# Patient Record
Sex: Male | Born: 1970
Health system: Southern US, Community
[De-identification: ages and names within clinical notes are randomized; demographics above are authoritative.]

## PROBLEM LIST (undated history)

## (undated) DIAGNOSIS — E7800 Pure hypercholesterolemia, unspecified: Secondary | ICD-10-CM

## (undated) DIAGNOSIS — Z87442 Personal history of urinary calculi: Secondary | ICD-10-CM

## (undated) DIAGNOSIS — M549 Dorsalgia, unspecified: Secondary | ICD-10-CM

## (undated) DIAGNOSIS — K219 Gastro-esophageal reflux disease without esophagitis: Secondary | ICD-10-CM

## (undated) HISTORY — PX: VASECTOMY: SHX75

## (undated) HISTORY — PX: TONSILLECTOMY: SUR1361

## (undated) HISTORY — PX: MOUTH SURGERY: SHX715

## (undated) HISTORY — PX: OTHER SURGICAL HISTORY: SHX169

---

## 2015-03-24 ENCOUNTER — Other Ambulatory Visit: Payer: Self-pay | Admitting: Chiropractic Medicine

## 2015-03-24 ENCOUNTER — Ambulatory Visit
Admission: RE | Admit: 2015-03-24 | Discharge: 2015-03-24 | Disposition: A | Discharge status: Home / Self Care | Payer: BLUE CROSS/BLUE SHIELD | Source: Ambulatory Visit | Attending: Chiropractic Medicine | Admitting: Chiropractic Medicine

## 2015-03-24 DIAGNOSIS — M25551 Pain in right hip: Secondary | ICD-10-CM

## 2015-04-23 ENCOUNTER — Other Ambulatory Visit: Payer: Self-pay | Admitting: Chiropractic Medicine

## 2015-04-23 DIAGNOSIS — M25551 Pain in right hip: Secondary | ICD-10-CM

## 2015-05-07 ENCOUNTER — Other Ambulatory Visit: Payer: BLUE CROSS/BLUE SHIELD

## 2015-06-23 ENCOUNTER — Ambulatory Visit
Admission: RE | Admit: 2015-06-23 | Discharge: 2015-06-23 | Disposition: A | Discharge status: Home / Self Care | Payer: BLUE CROSS/BLUE SHIELD | Source: Ambulatory Visit | Attending: Chiropractic Medicine | Admitting: Chiropractic Medicine

## 2015-06-23 DIAGNOSIS — M25551 Pain in right hip: Secondary | ICD-10-CM

## 2015-06-23 MED ORDER — IOHEXOL 180 MG/ML  SOLN
10.0000 mL | Freq: Once | INTRAMUSCULAR | Status: AC | PRN
  Administered 2015-06-23: 10 mL via INTRA_ARTICULAR

## 2016-06-20 DIAGNOSIS — J029 Acute pharyngitis, unspecified: Secondary | ICD-10-CM | POA: Diagnosis not present

## 2016-08-12 DIAGNOSIS — H524 Presbyopia: Secondary | ICD-10-CM | POA: Diagnosis not present

## 2017-05-09 IMAGING — MR MR HIP*R* W/CM
1 series · 19 of 19 positions shown · IV contrast (agent unspecified)
Comparison: None.

CLINICAL DATA: Right hip pain for 30 years.  No known injury.

EXAM:
MRI OF THE RIGHT HIP WITH CONTRAST(MR Arthrogram)
TECHNIQUE: Multiplanar, multisequence MR imaging of the hip was performed
immediately following contrast injection into the hip joint under
fluoroscopic guidance. No intravenous contrast was administered.

[Series 3: T1 · axial · 4.0mm · 0.35mm/px · z∈[+5,+77]mm · 19 of 19 slices shown]
[im 1/19]
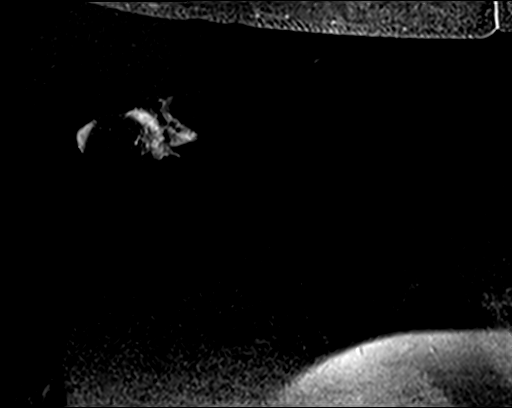
[im 2/19]
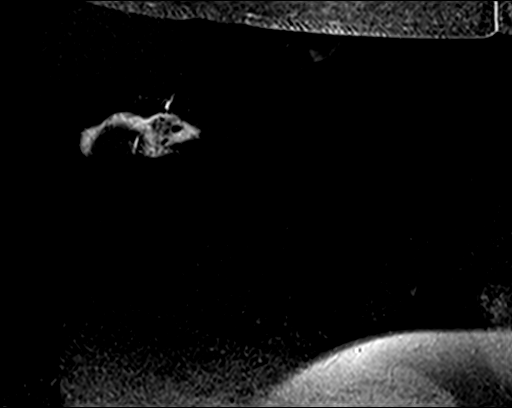
[im 3/19]
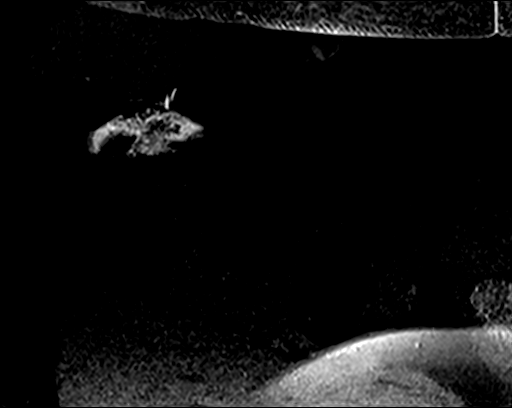
[im 4/19]
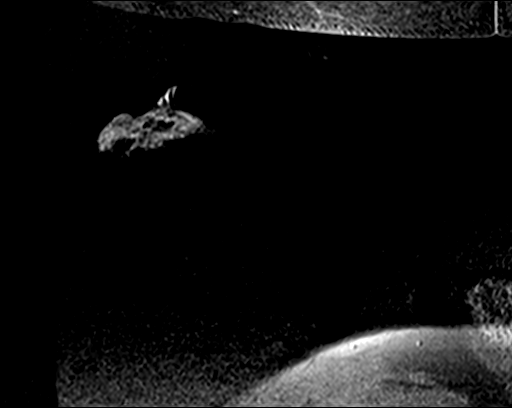
[im 5/19]
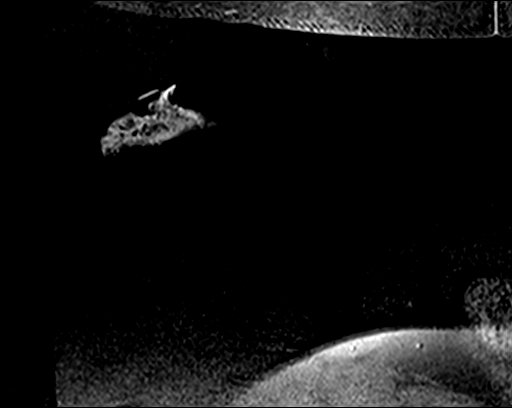
[im 6/19]
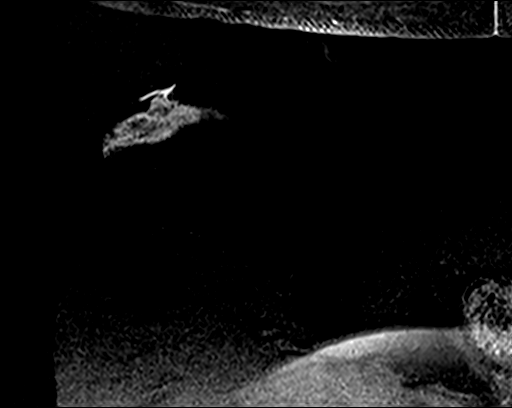
[im 7/19]
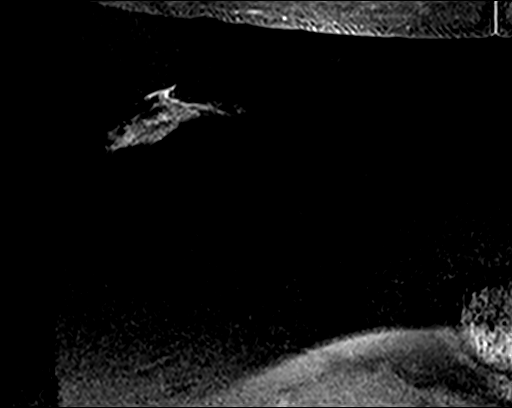
[im 8/19]
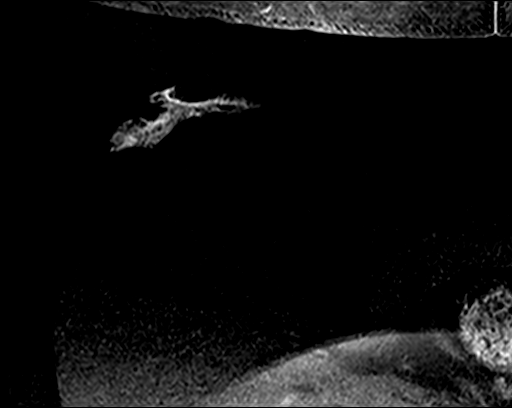
[im 9/19]
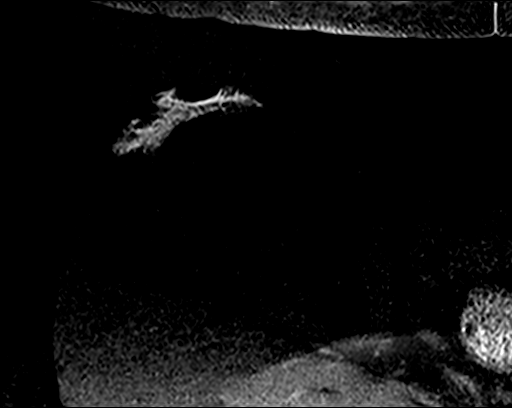
[im 10/19]
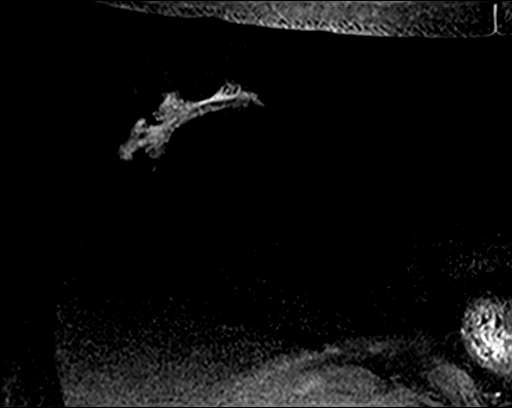
[im 11/19]
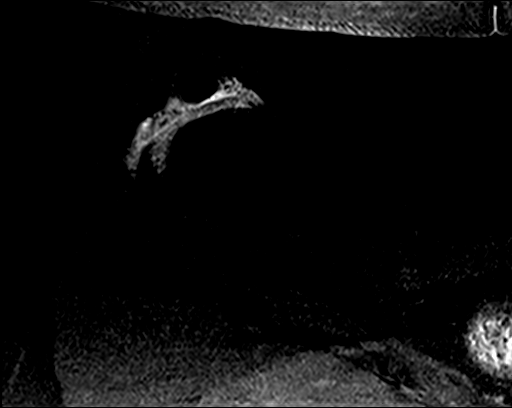
[im 12/19]
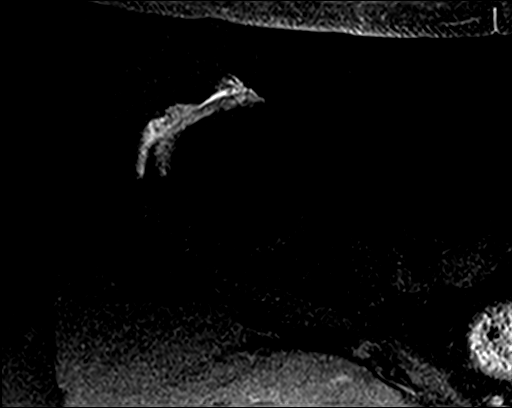
[im 13/19]
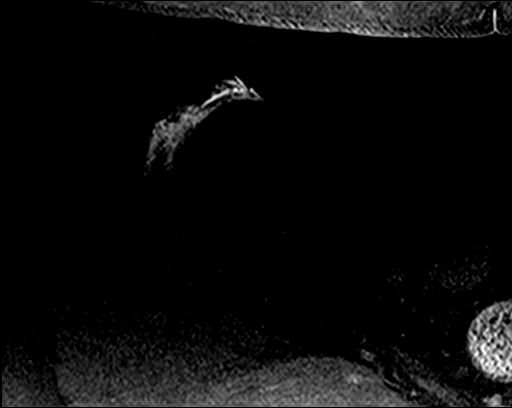
[im 14/19]
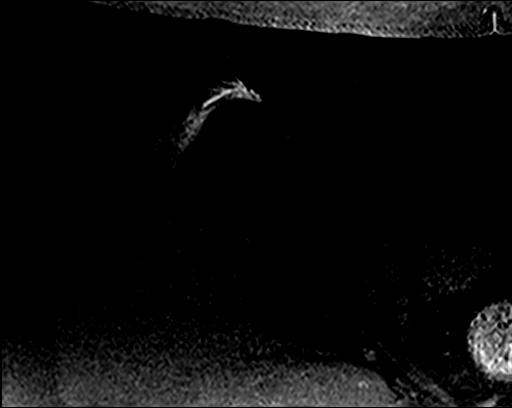
[im 15/19]
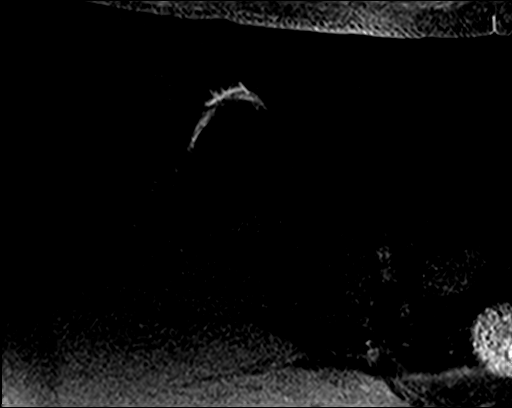
[im 16/19]
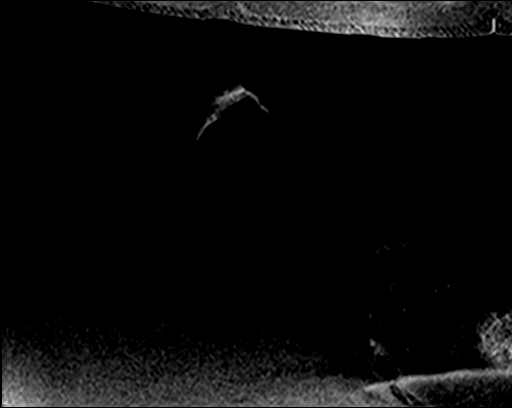
[im 17/19]
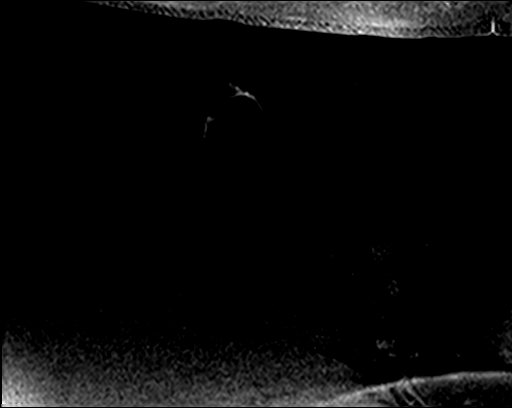
[im 18/19]
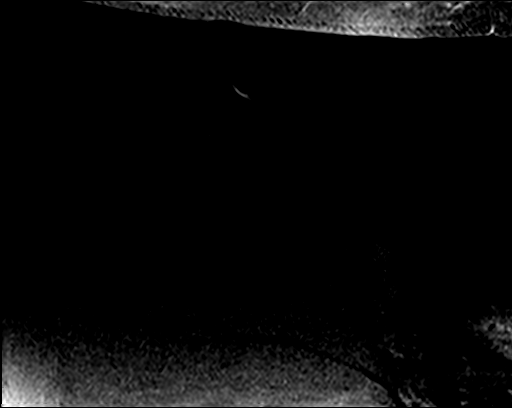
[im 19/19]
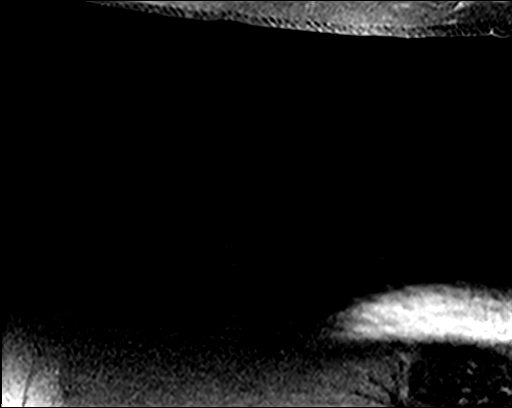

[19 of 19 positions shown; findings below may reference images not displayed]

FINDINGS: Bone

No acute fracture, dislocation or avascular necrosis mild
subchondral reactive marrow changes in the superior acetabulum
bilaterally. Bilateral marginal osteophytosis. Normal sacrum and
sacroiliac joints.

Alignment

Normal. No subluxation.

Dysplasia

None.

Joint effusion

Intra-articular contrast. No sacroiliac joint effusion. No left hip
joint effusion.

Labrum

Degeneration of the superior anterior right labrum with a labral
tear. No significant paralabral cyst identified.

Cartilage

Femoral cartilage: High-grade partial-thickness cartilage loss of
the superior right acetabulum. Left acetabular chondromalacia.

Acetabular cartilage: Partial thickness cartilage loss of the
superior femoral head. Left femoral head chondromalacia.

Capsule and ligaments

Normal.

Muscles and Tendons

Flexors: Normal.

Extensors: Normal.

Abductors: Normal.

Adductors: Normal.

Rotators: Normal.

Hamstrings: Small partial tear at the left hamstring origin.

Other Findings

None

Viscera

Normal. No abnormality seen in pelvis. No lymphadenopathy. No free
fluid in the pelvis.
IMPRESSION: 1. Degeneration of the superior anterior right labrum with a labral
tear. No significant paralabral cyst identified.
2. Moderate right hip osteoarthritis.
3. Mild left hip osteoarthritis.

## 2017-05-09 IMAGING — XA DG FLUORO GUIDE NDL PLC/BX
1 series · 1 of 1 positions shown · non-contrast
Comparison: none

CLINICAL DATA: Right hip pain.

[Series 1: ortho standard · 1 of 1 slices shown]
[im 1/1]
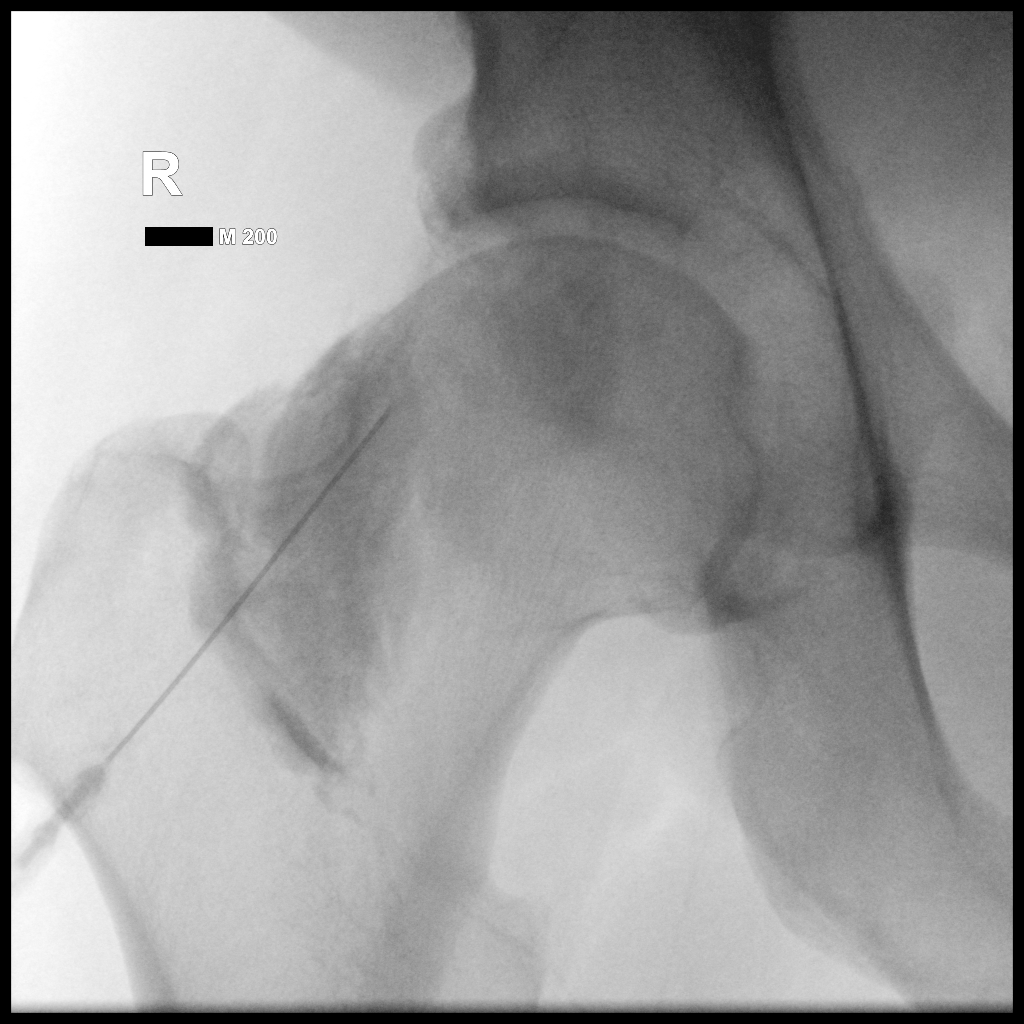

[1 of 1 positions shown; findings below may reference images not displayed]

EXAM:
RIGHT HIP INJECTION FOR MRI

FLUOROSCOPY TIME:  Radiation Exposure Index (as provided by the
fluoroscopic device): 124.9 microGray*m^2

Fluoroscopy Time (in minutes and seconds):  0 minutes 16 seconds

PROCEDURE:
Overlying skin prepped with Betadine, draped in the usual sterile
fashion, and infiltrated locally with Lidocaine. A 3.5 inch, 22
gauge spinal needle was advanced to the lateral aspect of the right
femoral head- neck junction. 1 ml of Lidocaine injected easily.
Initial contrast injection was extra-articular. The right hip was
reinjected with confirmation of intra-articular positioning. The
contrast mixture consisted of 0.05 mL of MultiHance, 5 mL of 1%
lidocaine, and 15 mL of Omnipaque 180. A total volume of 11 mL was
administered during the second injection. No immediate complication.
IMPRESSION: Technically successful right hip injection under fluoroscopy for MR
arthrogram.

## 2017-09-06 DIAGNOSIS — H6092 Unspecified otitis externa, left ear: Secondary | ICD-10-CM | POA: Diagnosis not present

## 2017-10-10 DIAGNOSIS — K625 Hemorrhage of anus and rectum: Secondary | ICD-10-CM | POA: Diagnosis not present

## 2018-01-17 DIAGNOSIS — E669 Obesity, unspecified: Secondary | ICD-10-CM | POA: Diagnosis not present

## 2018-01-17 DIAGNOSIS — Z13220 Encounter for screening for lipoid disorders: Secondary | ICD-10-CM | POA: Diagnosis not present

## 2018-01-17 DIAGNOSIS — Z0000 Encounter for general adult medical examination without abnormal findings: Secondary | ICD-10-CM | POA: Diagnosis not present

## 2018-04-24 DIAGNOSIS — H524 Presbyopia: Secondary | ICD-10-CM | POA: Diagnosis not present

## 2018-07-24 DIAGNOSIS — H60392 Other infective otitis externa, left ear: Secondary | ICD-10-CM | POA: Diagnosis not present

## 2018-08-01 DIAGNOSIS — H66002 Acute suppurative otitis media without spontaneous rupture of ear drum, left ear: Secondary | ICD-10-CM | POA: Diagnosis not present

## 2018-12-28 DIAGNOSIS — H60391 Other infective otitis externa, right ear: Secondary | ICD-10-CM | POA: Diagnosis not present

## 2019-01-30 DIAGNOSIS — H6983 Other specified disorders of Eustachian tube, bilateral: Secondary | ICD-10-CM | POA: Diagnosis not present

## 2019-02-17 DIAGNOSIS — Z1159 Encounter for screening for other viral diseases: Secondary | ICD-10-CM | POA: Diagnosis not present

## 2019-02-26 DIAGNOSIS — H66006 Acute suppurative otitis media without spontaneous rupture of ear drum, recurrent, bilateral: Secondary | ICD-10-CM | POA: Diagnosis not present

## 2019-02-26 DIAGNOSIS — H9313 Tinnitus, bilateral: Secondary | ICD-10-CM | POA: Diagnosis not present

## 2019-03-27 DIAGNOSIS — B369 Superficial mycosis, unspecified: Secondary | ICD-10-CM | POA: Diagnosis not present

## 2019-03-27 DIAGNOSIS — H6240 Otitis externa in other diseases classified elsewhere, unspecified ear: Secondary | ICD-10-CM | POA: Diagnosis not present

## 2019-03-27 DIAGNOSIS — H9312 Tinnitus, left ear: Secondary | ICD-10-CM | POA: Diagnosis not present

## 2019-03-27 DIAGNOSIS — H900 Conductive hearing loss, bilateral: Secondary | ICD-10-CM | POA: Diagnosis not present

## 2019-04-10 DIAGNOSIS — H6240 Otitis externa in other diseases classified elsewhere, unspecified ear: Secondary | ICD-10-CM | POA: Diagnosis not present

## 2019-04-10 DIAGNOSIS — H9313 Tinnitus, bilateral: Secondary | ICD-10-CM | POA: Diagnosis not present

## 2019-04-10 DIAGNOSIS — B369 Superficial mycosis, unspecified: Secondary | ICD-10-CM | POA: Diagnosis not present

## 2019-04-10 DIAGNOSIS — H9192 Unspecified hearing loss, left ear: Secondary | ICD-10-CM | POA: Diagnosis not present

## 2019-06-12 DIAGNOSIS — M7661 Achilles tendinitis, right leg: Secondary | ICD-10-CM | POA: Diagnosis not present

## 2019-06-12 DIAGNOSIS — M7662 Achilles tendinitis, left leg: Secondary | ICD-10-CM | POA: Diagnosis not present

## 2019-06-21 DIAGNOSIS — H524 Presbyopia: Secondary | ICD-10-CM | POA: Diagnosis not present

## 2019-07-22 DIAGNOSIS — E782 Mixed hyperlipidemia: Secondary | ICD-10-CM | POA: Diagnosis not present

## 2019-09-05 DIAGNOSIS — E669 Obesity, unspecified: Secondary | ICD-10-CM | POA: Diagnosis not present

## 2019-09-05 DIAGNOSIS — E782 Mixed hyperlipidemia: Secondary | ICD-10-CM | POA: Diagnosis not present

## 2019-10-02 DIAGNOSIS — R7303 Prediabetes: Secondary | ICD-10-CM | POA: Diagnosis not present

## 2019-10-02 DIAGNOSIS — E782 Mixed hyperlipidemia: Secondary | ICD-10-CM | POA: Diagnosis not present

## 2019-10-02 DIAGNOSIS — E669 Obesity, unspecified: Secondary | ICD-10-CM | POA: Diagnosis not present

## 2020-04-14 DIAGNOSIS — M25512 Pain in left shoulder: Secondary | ICD-10-CM | POA: Diagnosis not present

## 2020-04-14 DIAGNOSIS — Z23 Encounter for immunization: Secondary | ICD-10-CM | POA: Diagnosis not present

## 2020-04-14 DIAGNOSIS — B070 Plantar wart: Secondary | ICD-10-CM | POA: Diagnosis not present

## 2020-04-14 DIAGNOSIS — Z1211 Encounter for screening for malignant neoplasm of colon: Secondary | ICD-10-CM | POA: Diagnosis not present

## 2020-04-28 DIAGNOSIS — B070 Plantar wart: Secondary | ICD-10-CM | POA: Diagnosis not present

## 2020-05-06 DIAGNOSIS — M7542 Impingement syndrome of left shoulder: Secondary | ICD-10-CM | POA: Diagnosis not present

## 2020-05-06 DIAGNOSIS — M25512 Pain in left shoulder: Secondary | ICD-10-CM | POA: Diagnosis not present

## 2020-05-13 DIAGNOSIS — B070 Plantar wart: Secondary | ICD-10-CM | POA: Diagnosis not present

## 2020-06-09 DIAGNOSIS — B070 Plantar wart: Secondary | ICD-10-CM | POA: Diagnosis not present

## 2020-06-23 DIAGNOSIS — B070 Plantar wart: Secondary | ICD-10-CM | POA: Diagnosis not present

## 2020-07-07 DIAGNOSIS — B070 Plantar wart: Secondary | ICD-10-CM | POA: Diagnosis not present

## 2020-08-04 DIAGNOSIS — B070 Plantar wart: Secondary | ICD-10-CM | POA: Diagnosis not present

## 2020-08-18 DIAGNOSIS — M7912 Myalgia of auxiliary muscles, head and neck: Secondary | ICD-10-CM | POA: Diagnosis not present

## 2020-09-10 DIAGNOSIS — Z01812 Encounter for preprocedural laboratory examination: Secondary | ICD-10-CM | POA: Diagnosis not present

## 2020-09-15 DIAGNOSIS — K648 Other hemorrhoids: Secondary | ICD-10-CM | POA: Diagnosis not present

## 2020-09-15 DIAGNOSIS — K635 Polyp of colon: Secondary | ICD-10-CM | POA: Diagnosis not present

## 2020-09-15 DIAGNOSIS — K5730 Diverticulosis of large intestine without perforation or abscess without bleeding: Secondary | ICD-10-CM | POA: Diagnosis not present

## 2020-09-15 DIAGNOSIS — Z1211 Encounter for screening for malignant neoplasm of colon: Secondary | ICD-10-CM | POA: Diagnosis not present

## 2020-09-22 DIAGNOSIS — B079 Viral wart, unspecified: Secondary | ICD-10-CM | POA: Diagnosis not present

## 2020-10-19 ENCOUNTER — Ambulatory Visit: Payer: Self-pay | Admitting: Surgery

## 2020-10-19 DIAGNOSIS — K642 Third degree hemorrhoids: Secondary | ICD-10-CM | POA: Diagnosis not present

## 2020-10-19 DIAGNOSIS — K641 Second degree hemorrhoids: Secondary | ICD-10-CM | POA: Diagnosis not present

## 2020-10-19 DIAGNOSIS — K644 Residual hemorrhoidal skin tags: Secondary | ICD-10-CM | POA: Diagnosis not present

## 2020-10-19 NOTE — H&P (Signed)
Scott Stephenson Appointment: 10/19/2020 3:15 PM Location: [facility] Patient #: [id number] DOB: 1971/06/02 Married / Language: [place] / Race: White Male  History of Present Illness Scott Stephenson(Scott Stephenson C. Scott Gasparini MD; 10/19/2020 5:13 PM) The patient is a 50 year old male who presents with hemorrhoids. Note for "Hemorrhoids": ` ` ` Patient sent for surgical consultation at the request of Dr Moss McBrahmbhatt, [facility]  Chief Complaint: Rectal pain and hemorrhoids. ` ` The patient is a gentleman struggle with anal issues for many years. Thinks her hemorrhoids. He's had intermittent episodes of popping and swelling. Can often feel it on the outside. Had more challenging flares last year. Has tried to adjust his diet. Eating better with higher fiber. Still has occasionally hard stools. Usually has 1 or 2 a day. Has had worsening intermittent rectal bleeding. Had a colonoscopy. I cannot get those records. He recalls being told it was rather underwhelming aside from hemorrhoids. He has significant hemorrhoids of bleeding, surgical consultation recommended. Patient will have a without difficulty. He's never had any abdominal surgeries. No history of stroke or heart attack. No diabetes or sleep apnea. Does not smoke.  No personal nor family history of GI/colon cancer, inflammatory bowel disease, irritable bowel syndrome, allergy such as Celiac Sprue, dietary/dairy problems, colitis, ulcers nor gastritis. No recent sick contacts/gastroenteritis. No travel outside the country. No changes in diet. No dysphagia to solids or liquids. No significant heartburn or reflux. No melena, hematemesis, coffee ground emesis. No evidence of prior gastric/peptic ulceration.  (Review of systems as stated in this history (HPI) or in the review of systems. Otherwise all other 12 point ROS are negative) ` ` ###########################################`  This patient encounter took 30 minutes today to perform  the following: obtain history, perform exam, review outside records, interpret tests & imaging, counsel the patient on their diagnosis; and, document this encounter, including findings & plan in the electronic health record (EHR).   Past Surgical History [name]([name], CMA; 10/19/2020 3:21 PM) Colon Polyp Removal - Colonoscopy Oral Surgery Tonsillectomy Vasectomy  Diagnostic Studies History [name]([name], CMA; 10/19/2020 3:21 PM) Colonoscopy within last year  Allergies [name]([name], CMA; 10/19/2020 3:21 PM) No Known Drug Allergies [10/19/2020]:  Medication History [name]([name], CMA; 10/19/2020 3:21 PM) No Current Medications Medications Reconciled  Social History [name]([name], CMA; 10/19/2020 3:21 PM) Alcohol use Occasional alcohol use. Caffeine use Carbonated beverages. No drug use Tobacco use Never smoker.  Family History [name]([name], CMA; 10/19/2020 3:21 PM) Cancer Father, Mother. Depression Son. Migraine Headache Mother.  Other Problems [name]([name], CMA; 10/19/2020 3:21 PM) Back Pain Enlarged Prostate Gastroesophageal Reflux Disease Hemorrhoids Hypercholesterolemia Kidney Stone     Review of Systems [name]([name] CMA; 10/19/2020 3:21 PM) General Not Present- Appetite Loss, Chills, Fatigue, Fever, Night Sweats, Weight Gain and Weight Loss. Skin Not Present- Change in Wart/Mole, Dryness, Hives, Jaundice, New Lesions, Non-Healing Wounds, Rash and Ulcer. HEENT Present- Ringing in the Ears, Seasonal Allergies and Wears glasses/contact lenses. Not Present- Earache, Hearing Loss, Hoarseness, Nose Bleed, Oral Ulcers, Sinus Pain, Sore Throat, Visual Disturbances and Yellow Eyes. Respiratory Not Present- Bloody sputum, Chronic Cough, Difficulty Breathing, Snoring and Wheezing. Breast Not Present- Breast Mass, Breast Pain, Nipple Discharge and Skin Changes. Cardiovascular Not Present- Chest Pain, Difficulty Breathing Lying Down, Leg  Cramps, Palpitations, Rapid Heart Rate, Shortness of Breath and Swelling of Extremities. Gastrointestinal Present- Hemorrhoids. Not Present- Abdominal Pain, Bloating, Bloody Stool, Change in Bowel Habits, Chronic diarrhea, Constipation, Difficulty Swallowing, Excessive gas, Gets full quickly at meals,  Indigestion, Nausea, Rectal Pain and Vomiting. Male Genitourinary Not Present- Blood in Urine, Change in Urinary Stream, Frequency, Impotence, Nocturia, Painful Urination, Urgency and Urine Leakage. Musculoskeletal Not Present- Back Pain, Joint Pain, Joint Stiffness, Muscle Pain, Muscle Weakness and Swelling of Extremities. Neurological Not Present- Decreased Memory, Fainting, Headaches, Numbness, Seizures, Tingling, Tremor, Trouble walking and Weakness. Psychiatric Not Present- Anxiety, Bipolar, Change in Sleep Pattern, Depression, Fearful and Frequent crying. Endocrine Not Present- Cold Intolerance, Excessive Hunger, Hair Changes, Heat Intolerance, Hot flashes and New Diabetes. Hematology Not Present- Blood Thinners, Easy Bruising, Excessive bleeding, Gland problems, HIV and Persistent Infections.  Vitals ([name] CMA; 10/19/2020 3:21 PM) 10/19/2020 3:21 PM Weight: 217.2 lb Height: 71in Body Surface Area: 2.18 m Body Mass Index: 30.29 kg/m  Pulse: 62 (Regular)  BP: 110/64(Sitting, Left Arm, Standard)        Physical Exam Scott Sportsman MD; 10/19/2020 3:54 PM)  General Mental Status-Alert. General Appearance-Not in acute distress, Not Sickly. Orientation-Oriented X3. Hydration-Well hydrated. Voice-Normal.  Integumentary Global Assessment Upon inspection and palpation of skin surfaces of the - Axillae: non-tender, no inflammation or ulceration, no drainage. and Distribution of scalp and body hair is normal. General Characteristics Temperature - normal warmth is noted.  Head and Neck Head-normocephalic, atraumatic with no lesions or palpable  masses. Face Global Assessment - atraumatic, no absence of expression. Neck Global Assessment - no abnormal movements, no bruit auscultated on the right, no bruit auscultated on the left, no decreased range of motion, non-tender. Trachea-midline. Thyroid Gland Characteristics - non-tender.  Eye Eyeball - Left-Extraocular movements intact, No Nystagmus - Left. Eyeball - Right-Extraocular movements intact, No Nystagmus - Right. Cornea - Left-No Hazy - Left. Cornea - Right-No Hazy - Right. Sclera/Conjunctiva - Left-No scleral icterus, No Discharge - Left. Sclera/Conjunctiva - Right-No scleral icterus, No Discharge - Right. Pupil - Left-Direct reaction to light normal. Pupil - Right-Direct reaction to light normal.  ENMT Ears Pinna - Left - no drainage observed, no generalized tenderness observed. Pinna - Right - no drainage observed, no generalized tenderness observed. Nose and Sinuses External Inspection of the Nose - no destructive lesion observed. Inspection of the nares - Left - quiet respiration. Inspection of the nares - Right - quiet respiration. Mouth and Throat Lips - Upper Lip - no fissures observed, no pallor noted. Lower Lip - no fissures observed, no pallor noted. Nasopharynx - no discharge present. Oral Cavity/Oropharynx - Tongue - no dryness observed. Oral Mucosa - no cyanosis observed. Hypopharynx - no evidence of airway distress observed.  Chest and Lung Exam Inspection Movements - Normal and Symmetrical. Accessory muscles - No use of accessory muscles in breathing. Palpation Palpation of the chest reveals - Non-tender. Auscultation Breath sounds - Normal and Clear.  Cardiovascular Auscultation Rhythm - Regular. Murmurs & Other Heart Sounds - Auscultation of the heart reveals - No Murmurs and No Systolic Clicks.  Abdomen Inspection Inspection of the abdomen reveals - No Visible peristalsis and No Abnormal pulsations. Umbilicus - No Bleeding, No  Urine drainage. Palpation/Percussion Palpation and Percussion of the abdomen reveal - Soft, Non Tender, No Rebound tenderness, No Rigidity (guarding) and No Cutaneous hyperesthesia. Note: Abdomen soft. Nontender. Not distended. No umbilical or incisional hernias. No guarding.  Male Genitourinary Sexual Maturity Tanner 5 - Adult hair pattern and Adult penile size and shape. Note: No inguinal hernias. Normal external genitalia. Epididymi, testes, and spermatic cords normal without any masses.  Rectal Note: ###########################  Please refer to anoscopy.  Left lateral grade 3/4 prolapsed  hemorrhoid friable. Large right posterior internal hemorrhoid at least grade 2 probably grade 3. Right anterior grade 2.  No fissure or fistula or abscess. A few external hemorrhoidal tags. Thin but intact anal sphincter. No proctitis obvious. No condyloma. Prostate smooth and not enlarged.  Peripheral Vascular Upper Extremity Inspection - Left - No Cyanotic nailbeds - Left, Not Ischemic. Inspection - Right - No Cyanotic nailbeds - Right, Not Ischemic.  Neurologic Neurologic evaluation reveals -normal attention span and ability to concentrate, able to name objects and repeat phrases. Appropriate fund of knowledge , normal sensation and normal coordination. Mental Status Affect - not angry, not paranoid. Cranial Nerves-Normal Bilaterally. Gait-Normal.  Neuropsychiatric Mental status exam performed with findings of-able to articulate well with normal speech/language, rate, volume and coherence, thought content normal with ability to perform basic computations and apply abstract reasoning and no evidence of hallucinations, delusions, obsessions or homicidal/suicidal ideation.  Musculoskeletal Global Assessment Spine, Ribs and Pelvis - no instability, subluxation or laxity. Right Upper Extremity - no instability, subluxation or laxity.  Lymphatic Head & Neck  General Head & Neck  Lymphatics: Bilateral - Description - No Localized lymphadenopathy. Axillary  General Axillary Region: Bilateral - Description - No Localized lymphadenopathy. Femoral & Inguinal  Generalized Femoral & Inguinal Lymphatics: Left - Description - No Localized lymphadenopathy. Right - Description - No Localized lymphadenopathy.   Results Scott Sportsman MD; 10/19/2020 5:12 PM) Procedures  Name Value Date Hemorrhoids Procedure Anal exam: External Hemorrhoid Internal exam: Internal Hemorroids ( non-bleeding) Internal Hemorrhoids (bleeding) prolapse Other: Left lateral grade 3/4 prolapsed hemorrhoid friable. Large right posterior internal hemorrhoid at least grade 2 probably grade 3. Right anterior grade 2............Marland KitchenNo fissure or fistula or abscess. A few external hemorrhoidal tags. Thin but intact anal sphincter. No proctitis obvious. No condyloma. Prostate smooth and not enlarged.  Performed: 10/19/2020 3:54 PM    Assessment & Plan Scott Sportsman MD; 10/19/2020 3:57 PM)  PROLAPSED INTERNAL HEMORRHOIDS, GRADE 3 (K64.2) Impression: Large internal hemorrhoids with left lateral partially prolapsed. Right posterior rather large and easily prolapsing. Still struggled with intermittent flares of bleeding and discomfort despite good diet bowel regimen and relatively improved bowel function.  At the very least we could offer her series of banding spelled I suspect hemorrhoid ligation Paxene hemorrhoidectomy would be a day more aggressive and appropriate option. He would like to consider hemorrhoidectomy to get his anatomy after better control. He wants double check about insurance coverage and postoperative recovery. Work to coordinate a convenient time.  Current Plans ANOSCOPY, DIAGNOSTIC (37342) Pt Education - [facility] Hemorrhoids (Barak Bialecki): discussed with patient and provided information. Pt Education - Pamphlet Given - The Hemorrhoid Book: discussed with patient and  provided information. The anatomy & physiology of the anorectal region was discussed. The pathophysiology of hemorrhoids and differential diagnosis was discussed. Natural history risks without surgery was discussed. I stressed the importance of a bowel regimen to have daily soft bowel movements to minimize progression of disease. Interventions such as sclerotherapy & banding were discussed.  The patient's symptoms are not adequately controlled by medicines and other non-operative treatments. I feel the risks & problems of no surgery outweigh the operative risks; therefore, I recommended surgery to treat the hemorrhoids by ligation, pexy, and possible resection.  Risks such as bleeding, infection, urinary difficulties, need for further treatment, heart attack, death, and other risks were discussed. I noted a good likelihood this will help address the problem. Goals of post-operative recovery were discussed as well. Possibility that this will  not correct all symptoms was explained. Post-operative pain, bleeding, constipation, and other problems after surgery were discussed. We will work to minimize complications. Educational handouts further explaining the pathology, treatment options, and bowel regimen were given as well. Questions were answered. The patient expresses understanding & wishes to proceed with surgery.   PROLAPSED INTERNAL HEMORRHOIDS, GRADE 2 (K64.1) Impression: The anatomy & physiology of the anorectal region was discussed. The pathophysiology of hemorrhoids and differential diagnosis was discussed. Natural history progression was discussed. I stressed the importance of a bowel regimen to have daily soft bowel movements to minimize progression of disease. Goal of one BM / day ideal. Use of wet wipes, warm baths, avoiding straining, etc were emphasized.  Educational handouts further explaining the pathology, treatment options, and bowel regimen were given as well. The patient  expressed understanding.   EXTERNAL HEMORRHOIDS WITH COMPLICATION (K64.4)   ENCOUNTER FOR PREOPERATIVE EXAMINATION FOR GENERAL SURGICAL PROCEDURE (Z01.818)  Current Plans You are being scheduled for surgery- Our schedulers will call you.  You should hear from our office's scheduling department within 5 working days about the location, date, and time of surgery. We try to make accommodations for patient's preferences in scheduling surgery, but sometimes the OR schedule or the surgeon's schedule prevents [place] from making those accommodations.  If you have not heard from our office [phone]) in 5 working days, call the office and ask for your surgeon's nurse.  If you have other questions about your diagnosis, plan, or surgery, call the office and ask for your surgeon's nurse.  The anatomy & physiology of the anorectal region was discussed. The pathophysiology of hemorrhoids and differential diagnosis was discussed. Natural history risks without surgery was discussed. I stressed the importance of a bowel regimen to have daily soft bowel movements to minimize progression of disease. Interventions such as sclerotherapy & banding were discussed.  The patient's symptoms are not adequately controlled by medicines and other non-operative treatments. I feel the risks & problems of no surgery outweigh the operative risks; therefore, I recommended surgery to treat the hemorrhoids by ligation, pexy, and possible resection.  Risks such as bleeding, infection, urinary difficulties, need for further treatment, heart attack, death, and other risks were discussed. I noted a good likelihood this will help address the problem. Goals of post-operative recovery were discussed as well. Possibility that this will not correct all symptoms was explained. Post-operative pain, bleeding, constipation, and other problems after surgery were discussed. We will work to minimize complications. Educational  handouts further explaining the pathology, treatment options, and bowel regimen were given as well. Questions were answered. The patient expresses understanding & wishes to proceed with surgery.  Pt Education - [facility] Rectal Prep for Anorectal outpatient/office surgery: discussed with patient and provided information. Pt Education - [facility] Rectal Surgery HCI (Bettyjean Stefanski): discussed with patient and provided information.  Scott Sportsman, MD, FACS, MASCRS  Esophageal, Gastrointestinal & Colorectal Surgery [facility] [address] [place], [place] [address] [phone] Fax [phone] Main/Paging  CONTACT INFORMATION: Weekday (9AM-5PM) concerns: Call [facility] main office at [phone] Weeknight (5PM-9AM) or Weekend/Holiday concerns: Check [email or web address] for General Surgery [facility] coverage (Please, do not use SecureChat as it is not reliable communication to operating surgeons for immediate patient care)

## 2020-10-20 DIAGNOSIS — H524 Presbyopia: Secondary | ICD-10-CM | POA: Diagnosis not present

## 2020-11-06 DIAGNOSIS — M7661 Achilles tendinitis, right leg: Secondary | ICD-10-CM | POA: Diagnosis not present

## 2020-11-06 DIAGNOSIS — M545 Low back pain, unspecified: Secondary | ICD-10-CM | POA: Diagnosis not present

## 2020-11-06 DIAGNOSIS — M7662 Achilles tendinitis, left leg: Secondary | ICD-10-CM | POA: Diagnosis not present

## 2020-11-10 DIAGNOSIS — B078 Other viral warts: Secondary | ICD-10-CM | POA: Diagnosis not present

## 2020-11-13 DIAGNOSIS — M779 Enthesopathy, unspecified: Secondary | ICD-10-CM

## 2020-11-13 HISTORY — DX: Enthesopathy, unspecified: M77.9

## 2020-11-17 DIAGNOSIS — M9906 Segmental and somatic dysfunction of lower extremity: Secondary | ICD-10-CM | POA: Diagnosis not present

## 2020-11-17 DIAGNOSIS — M7711 Lateral epicondylitis, right elbow: Secondary | ICD-10-CM | POA: Diagnosis not present

## 2020-11-17 DIAGNOSIS — M7662 Achilles tendinitis, left leg: Secondary | ICD-10-CM | POA: Diagnosis not present

## 2020-11-17 DIAGNOSIS — M1611 Unilateral primary osteoarthritis, right hip: Secondary | ICD-10-CM | POA: Diagnosis not present

## 2020-11-19 DIAGNOSIS — M9906 Segmental and somatic dysfunction of lower extremity: Secondary | ICD-10-CM | POA: Diagnosis not present

## 2020-11-19 DIAGNOSIS — M7711 Lateral epicondylitis, right elbow: Secondary | ICD-10-CM | POA: Diagnosis not present

## 2020-11-19 DIAGNOSIS — M1611 Unilateral primary osteoarthritis, right hip: Secondary | ICD-10-CM | POA: Diagnosis not present

## 2020-11-19 DIAGNOSIS — M7662 Achilles tendinitis, left leg: Secondary | ICD-10-CM | POA: Diagnosis not present

## 2020-11-23 DIAGNOSIS — M9906 Segmental and somatic dysfunction of lower extremity: Secondary | ICD-10-CM | POA: Diagnosis not present

## 2020-11-23 DIAGNOSIS — M1611 Unilateral primary osteoarthritis, right hip: Secondary | ICD-10-CM | POA: Diagnosis not present

## 2020-11-23 DIAGNOSIS — M7662 Achilles tendinitis, left leg: Secondary | ICD-10-CM | POA: Diagnosis not present

## 2020-11-23 DIAGNOSIS — M7711 Lateral epicondylitis, right elbow: Secondary | ICD-10-CM | POA: Diagnosis not present

## 2020-12-17 DIAGNOSIS — B078 Other viral warts: Secondary | ICD-10-CM | POA: Diagnosis not present

## 2020-12-18 ENCOUNTER — Encounter (HOSPITAL_BASED_OUTPATIENT_CLINIC_OR_DEPARTMENT_OTHER): Payer: Self-pay | Admitting: Surgery

## 2020-12-18 DIAGNOSIS — K649 Unspecified hemorrhoids: Secondary | ICD-10-CM

## 2020-12-18 DIAGNOSIS — K6289 Other specified diseases of anus and rectum: Secondary | ICD-10-CM

## 2020-12-18 HISTORY — DX: Other specified diseases of anus and rectum: K62.89

## 2020-12-18 HISTORY — DX: Unspecified hemorrhoids: K64.9

## 2020-12-21 ENCOUNTER — Encounter (HOSPITAL_BASED_OUTPATIENT_CLINIC_OR_DEPARTMENT_OTHER): Payer: Self-pay | Admitting: Surgery

## 2020-12-21 ENCOUNTER — Other Ambulatory Visit (HOSPITAL_COMMUNITY)
Admission: RE | Admit: 2020-12-21 | Discharge: 2020-12-21 | Disposition: A | Discharge status: Home / Self Care | Payer: BC Managed Care – PPO | Source: Ambulatory Visit | Attending: Surgery | Admitting: Surgery

## 2020-12-21 ENCOUNTER — Other Ambulatory Visit: Payer: Self-pay

## 2020-12-21 DIAGNOSIS — K644 Residual hemorrhoidal skin tags: Secondary | ICD-10-CM | POA: Diagnosis not present

## 2020-12-21 DIAGNOSIS — Z20822 Contact with and (suspected) exposure to covid-19: Secondary | ICD-10-CM | POA: Insufficient documentation

## 2020-12-21 DIAGNOSIS — K642 Third degree hemorrhoids: Secondary | ICD-10-CM | POA: Diagnosis not present

## 2020-12-21 DIAGNOSIS — K625 Hemorrhage of anus and rectum: Secondary | ICD-10-CM | POA: Diagnosis not present

## 2020-12-21 DIAGNOSIS — Z01812 Encounter for preprocedural laboratory examination: Secondary | ICD-10-CM | POA: Insufficient documentation

## 2020-12-21 DIAGNOSIS — M1990 Unspecified osteoarthritis, unspecified site: Secondary | ICD-10-CM

## 2020-12-21 DIAGNOSIS — B079 Viral wart, unspecified: Secondary | ICD-10-CM

## 2020-12-21 DIAGNOSIS — R519 Headache, unspecified: Secondary | ICD-10-CM

## 2020-12-21 DIAGNOSIS — K219 Gastro-esophageal reflux disease without esophagitis: Secondary | ICD-10-CM | POA: Diagnosis not present

## 2020-12-21 DIAGNOSIS — Z973 Presence of spectacles and contact lenses: Secondary | ICD-10-CM

## 2020-12-21 HISTORY — DX: Presence of spectacles and contact lenses: Z97.3

## 2020-12-21 HISTORY — DX: Viral wart, unspecified: B07.9

## 2020-12-21 HISTORY — DX: Headache, unspecified: R51.9

## 2020-12-21 HISTORY — DX: Unspecified osteoarthritis, unspecified site: M19.90

## 2020-12-21 LAB — SARS CORONAVIRUS 2 (TAT 6-24 HRS): SARS Coronavirus 2: NEGATIVE

## 2020-12-21 NOTE — Progress Notes (Signed)
Spoke w/ via phone for pre-op interview---pt Lab needs dos----   none           Lab results------none COVID test -----patient states asymptomatic no test needed Arrive at -------815 am 12-24-2020 NPO after MN NO Solid Food.  Clear liquids from MN until---715 am then npo (pt wishes to not pick up ensure presurgery drink) Med rec completed Medications to take morning of surgery -----none Diabetic medication -----n/a Patient instructed no nail polish to be worn day of surgery n/a Patient instructed to bring photo id and insurance card day of surgery Patient aware to have Driver (ride ) / caregiver   wife Scott Stephenson will stay  for 24 hours after surgery  Patient Special Instructions -----none Pre-Op special Istructions -----none Patient verbalized understanding of instructions that were given at this phone interview. Patient denies shortness of breath, chest pain, fever, cough at this phone interview.

## 2020-12-23 NOTE — Anesthesia Preprocedure Evaluation (Addendum)
Anesthesia Evaluation  Patient identified by MRN, date of birth, ID band Patient awake    Reviewed: Allergy & Precautions, NPO status , Patient's Chart, lab work & pertinent test results  History of Anesthesia Complications Negative for: history of anesthetic complications  Airway Mallampati: II  TM Distance: >3 FB Neck ROM: Full    Dental no notable dental hx. (+) Dental Advisory Given   Pulmonary neg pulmonary ROS,    Pulmonary exam normal        Cardiovascular negative cardio ROS Normal cardiovascular exam     Neuro/Psych negative neurological ROS     GI/Hepatic Neg liver ROS, GERD  ,  Endo/Other  negative endocrine ROS  Renal/GU negative Renal ROS     Musculoskeletal  (+) Arthritis ,   Abdominal   Peds  Hematology negative hematology ROS (+)   Anesthesia Other Findings   Reproductive/Obstetrics                            Anesthesia Physical Anesthesia Plan  ASA: 2  Anesthesia Plan: General   Post-op Pain Management:    Induction: Intravenous  PONV Risk Score and Plan: 3 and Ondansetron, Dexamethasone and Midazolam  Airway Management Planned: Oral ETT  Additional Equipment:   Intra-op Plan:   Post-operative Plan: Extubation in OR  Informed Consent: I have reviewed the patients History and Physical, chart, labs and discussed the procedure including the risks, benefits and alternatives for the proposed anesthesia with the patient or authorized representative who has indicated his/her understanding and acceptance.     Dental advisory given  Plan Discussed with: Anesthesiologist and CRNA  Anesthesia Plan Comments:       Anesthesia Quick Evaluation

## 2020-12-24 ENCOUNTER — Ambulatory Visit (HOSPITAL_BASED_OUTPATIENT_CLINIC_OR_DEPARTMENT_OTHER): Payer: BC Managed Care – PPO | Admitting: Anesthesiology

## 2020-12-24 ENCOUNTER — Encounter (HOSPITAL_BASED_OUTPATIENT_CLINIC_OR_DEPARTMENT_OTHER)
Admission: RE | Disposition: A | Discharge status: Home / Self Care | Payer: Self-pay | Source: Home / Self Care | Attending: Surgery

## 2020-12-24 ENCOUNTER — Ambulatory Visit (HOSPITAL_BASED_OUTPATIENT_CLINIC_OR_DEPARTMENT_OTHER)
Admission: RE | Admit: 2020-12-24 | Discharge: 2020-12-24 | Disposition: A | Discharge status: Home / Self Care | Payer: BC Managed Care – PPO | Attending: Surgery | Admitting: Surgery

## 2020-12-24 ENCOUNTER — Encounter (HOSPITAL_BASED_OUTPATIENT_CLINIC_OR_DEPARTMENT_OTHER): Payer: Self-pay | Admitting: Surgery

## 2020-12-24 DIAGNOSIS — Z20822 Contact with and (suspected) exposure to covid-19: Secondary | ICD-10-CM | POA: Diagnosis not present

## 2020-12-24 DIAGNOSIS — K644 Residual hemorrhoidal skin tags: Secondary | ICD-10-CM | POA: Insufficient documentation

## 2020-12-24 DIAGNOSIS — K643 Fourth degree hemorrhoids: Secondary | ICD-10-CM | POA: Diagnosis not present

## 2020-12-24 DIAGNOSIS — K219 Gastro-esophageal reflux disease without esophagitis: Secondary | ICD-10-CM | POA: Insufficient documentation

## 2020-12-24 DIAGNOSIS — K642 Third degree hemorrhoids: Secondary | ICD-10-CM | POA: Insufficient documentation

## 2020-12-24 DIAGNOSIS — E7800 Pure hypercholesterolemia, unspecified: Secondary | ICD-10-CM | POA: Diagnosis not present

## 2020-12-24 DIAGNOSIS — K625 Hemorrhage of anus and rectum: Secondary | ICD-10-CM | POA: Diagnosis not present

## 2020-12-24 HISTORY — PX: EVALUATION UNDER ANESTHESIA WITH HEMORRHOIDECTOMY: SHX5624

## 2020-12-24 HISTORY — DX: Gastro-esophageal reflux disease without esophagitis: K21.9

## 2020-12-24 HISTORY — DX: Dorsalgia, unspecified: M54.9

## 2020-12-24 HISTORY — DX: Pure hypercholesterolemia, unspecified: E78.00

## 2020-12-24 HISTORY — DX: Personal history of urinary calculi: Z87.442

## 2020-12-24 SURGERY — EXAM UNDER ANESTHESIA WITH HEMORRHOIDECTOMY
Anesthesia: General | Site: Rectum

## 2020-12-24 MED ORDER — GABAPENTIN 300 MG PO CAPS
300.0000 mg | ORAL_CAPSULE | ORAL | Status: AC
  Administered 2020-12-24: 300 mg via ORAL

## 2020-12-24 MED ORDER — DIAZEPAM 5 MG PO TABS: 5.0000 mg | ORAL_TABLET | Freq: Four times a day (QID) | ORAL | 2 refills | Status: AC | PRN

## 2020-12-24 MED ORDER — OXYCODONE HCL 5 MG PO TABS: 5.0000 mg | ORAL_TABLET | Freq: Four times a day (QID) | ORAL | 0 refills | Status: AC | PRN

## 2020-12-24 MED ORDER — OXYCODONE HCL 5 MG PO TABS
5.0000 mg | ORAL_TABLET | ORAL | Status: AC
  Administered 2020-12-24: 5 mg via ORAL

## 2020-12-24 MED ORDER — ONDANSETRON HCL 4 MG/2ML IJ SOLN
INTRAMUSCULAR | Status: AC
  Filled 2020-12-24: qty 2

## 2020-12-24 MED ORDER — EPHEDRINE SULFATE-NACL 50-0.9 MG/10ML-% IV SOSY
PREFILLED_SYRINGE | INTRAVENOUS | Status: DC | PRN
  Administered 2020-12-24: 10 mg via INTRAVENOUS
  Administered 2020-12-24: 15 mg via INTRAVENOUS

## 2020-12-24 MED ORDER — PROPOFOL 10 MG/ML IV BOLUS
INTRAVENOUS | Status: DC | PRN
  Administered 2020-12-24: 200 mg via INTRAVENOUS

## 2020-12-24 MED ORDER — PROMETHAZINE HCL 25 MG/ML IJ SOLN: 6.2500 mg | INTRAMUSCULAR | Status: DC | PRN

## 2020-12-24 MED ORDER — EPHEDRINE 5 MG/ML INJ
INTRAVENOUS | Status: AC
  Filled 2020-12-24: qty 10

## 2020-12-24 MED ORDER — ONDANSETRON HCL 4 MG/2ML IJ SOLN
INTRAMUSCULAR | Status: DC | PRN
  Administered 2020-12-24: 4 mg via INTRAVENOUS

## 2020-12-24 MED ORDER — 0.9 % SODIUM CHLORIDE (POUR BTL) OPTIME
TOPICAL | Status: DC | PRN
  Administered 2020-12-24: 1000 mL

## 2020-12-24 MED ORDER — MIDAZOLAM HCL 2 MG/2ML IJ SOLN
INTRAMUSCULAR | Status: DC | PRN
  Administered 2020-12-24: 2 mg via INTRAVENOUS

## 2020-12-24 MED ORDER — PHENYLEPHRINE 40 MCG/ML (10ML) SYRINGE FOR IV PUSH (FOR BLOOD PRESSURE SUPPORT)
PREFILLED_SYRINGE | INTRAVENOUS | Status: AC
  Filled 2020-12-24: qty 10

## 2020-12-24 MED ORDER — ENSURE PRE-SURGERY PO LIQD: 296.0000 mL | Freq: Once | ORAL | Status: DC

## 2020-12-24 MED ORDER — SUGAMMADEX SODIUM 200 MG/2ML IV SOLN
INTRAVENOUS | Status: DC | PRN
  Administered 2020-12-24: 200 mg via INTRAVENOUS

## 2020-12-24 MED ORDER — DEXAMETHASONE SODIUM PHOSPHATE 10 MG/ML IJ SOLN
INTRAMUSCULAR | Status: DC | PRN
  Administered 2020-12-24: 5 mg via INTRAVENOUS

## 2020-12-24 MED ORDER — BUPIVACAINE LIPOSOME 1.3 % IJ SUSP
INTRAMUSCULAR | Status: DC | PRN
  Administered 2020-12-24: 20 mL

## 2020-12-24 MED ORDER — BUPIVACAINE LIPOSOME 1.3 % IJ SUSP
20.0000 mL | Freq: Once | INTRAMUSCULAR | Status: DC
Start: 2020-12-24 — End: 2020-12-25

## 2020-12-24 MED ORDER — METRONIDAZOLE 500 MG/100ML IV SOLN
INTRAVENOUS | Status: AC
  Filled 2020-12-24: qty 100

## 2020-12-24 MED ORDER — CELECOXIB 200 MG PO CAPS
200.0000 mg | ORAL_CAPSULE | ORAL | Status: AC
  Administered 2020-12-24: 200 mg via ORAL

## 2020-12-24 MED ORDER — SODIUM CHLORIDE 0.9 % IV SOLN
INTRAVENOUS | Status: AC
  Filled 2020-12-24: qty 100

## 2020-12-24 MED ORDER — GABAPENTIN 300 MG PO CAPS
ORAL_CAPSULE | ORAL | Status: AC
  Filled 2020-12-24: qty 1

## 2020-12-24 MED ORDER — ACETAMINOPHEN 500 MG PO TABS
ORAL_TABLET | ORAL | Status: AC
  Filled 2020-12-24: qty 2

## 2020-12-24 MED ORDER — FENTANYL CITRATE (PF) 100 MCG/2ML IJ SOLN
INTRAMUSCULAR | Status: AC
  Filled 2020-12-24: qty 2

## 2020-12-24 MED ORDER — CHLORHEXIDINE GLUCONATE CLOTH 2 % EX PADS
6.0000 | MEDICATED_PAD | Freq: Once | CUTANEOUS | Status: DC
Start: 2020-12-24 — End: 2020-12-25

## 2020-12-24 MED ORDER — OXYCODONE HCL 5 MG PO TABS
ORAL_TABLET | ORAL | Status: AC
  Filled 2020-12-24: qty 1

## 2020-12-24 MED ORDER — DIAZEPAM 5 MG PO TABS: 5.0000 mg | ORAL_TABLET | Freq: Four times a day (QID) | ORAL | Status: DC | PRN

## 2020-12-24 MED ORDER — SODIUM CHLORIDE 0.9 % IV SOLN: INTRAVENOUS | Status: DC

## 2020-12-24 MED ORDER — FENTANYL CITRATE (PF) 100 MCG/2ML IJ SOLN
INTRAMUSCULAR | Status: DC | PRN
  Administered 2020-12-24: 50 ug via INTRAVENOUS
  Administered 2020-12-24: 25 ug via INTRAVENOUS
  Administered 2020-12-24 (×2): 50 ug via INTRAVENOUS
  Administered 2020-12-24: 25 ug via INTRAVENOUS

## 2020-12-24 MED ORDER — LIDOCAINE HCL (PF) 2 % IJ SOLN
INTRAMUSCULAR | Status: AC
  Filled 2020-12-24: qty 5

## 2020-12-24 MED ORDER — CELECOXIB 200 MG PO CAPS
ORAL_CAPSULE | ORAL | Status: AC
  Filled 2020-12-24: qty 1

## 2020-12-24 MED ORDER — DEXAMETHASONE SODIUM PHOSPHATE 10 MG/ML IJ SOLN
INTRAMUSCULAR | Status: AC
  Filled 2020-12-24: qty 1

## 2020-12-24 MED ORDER — AMISULPRIDE (ANTIEMETIC) 5 MG/2ML IV SOLN: 10.0000 mg | Freq: Once | INTRAVENOUS | Status: DC | PRN

## 2020-12-24 MED ORDER — SODIUM CHLORIDE 0.9 % IV SOLN
2.0000 g | INTRAVENOUS | Status: AC
  Administered 2020-12-24: 2 g via INTRAVENOUS

## 2020-12-24 MED ORDER — FENTANYL CITRATE (PF) 100 MCG/2ML IJ SOLN: 25.0000 ug | INTRAMUSCULAR | Status: DC | PRN

## 2020-12-24 MED ORDER — MIDAZOLAM HCL 2 MG/2ML IJ SOLN
INTRAMUSCULAR | Status: AC
  Filled 2020-12-24: qty 2

## 2020-12-24 MED ORDER — METRONIDAZOLE 500 MG/100ML IV SOLN
500.0000 mg | INTRAVENOUS | Status: AC
  Administered 2020-12-24: 500 mg via INTRAVENOUS

## 2020-12-24 MED ORDER — LIDOCAINE 2% (20 MG/ML) 5 ML SYRINGE
INTRAMUSCULAR | Status: DC | PRN
  Administered 2020-12-24: 100 mg via INTRAVENOUS

## 2020-12-24 MED ORDER — ROCURONIUM BROMIDE 10 MG/ML (PF) SYRINGE
PREFILLED_SYRINGE | INTRAVENOUS | Status: AC
  Filled 2020-12-24: qty 10

## 2020-12-24 MED ORDER — ROCURONIUM BROMIDE 10 MG/ML (PF) SYRINGE
PREFILLED_SYRINGE | INTRAVENOUS | Status: DC | PRN
  Administered 2020-12-24: 80 mg via INTRAVENOUS

## 2020-12-24 MED ORDER — ACETAMINOPHEN 500 MG PO TABS
1000.0000 mg | ORAL_TABLET | ORAL | Status: AC
  Administered 2020-12-24: 1000 mg via ORAL

## 2020-12-24 MED ORDER — CEFTRIAXONE SODIUM 2 G IJ SOLR
INTRAMUSCULAR | Status: AC
  Filled 2020-12-24: qty 20

## 2020-12-24 MED ORDER — CHLORHEXIDINE GLUCONATE CLOTH 2 % EX PADS: 6.0000 | MEDICATED_PAD | Freq: Once | CUTANEOUS | Status: DC

## 2020-12-24 MED ORDER — BUPIVACAINE-EPINEPHRINE 0.25% -1:200000 IJ SOLN
INTRAMUSCULAR | Status: DC | PRN
  Administered 2020-12-24: 20 mL

## 2020-12-24 SURGICAL SUPPLY — 54 items
APL SKNCLS STERI-STRIP NONHPOA (GAUZE/BANDAGES/DRESSINGS) ×1
BENZOIN TINCTURE PRP APPL 2/3 (GAUZE/BANDAGES/DRESSINGS) ×2 IMPLANT
BLADE HEX COATED 2.75 (ELECTRODE) ×2 IMPLANT
BLADE SURG 10 STRL SS (BLADE) IMPLANT
BLADE SURG 15 STRL LF DISP TIS (BLADE) ×1 IMPLANT
BLADE SURG 15 STRL SS (BLADE) ×2
BRIEF STRETCH FOR OB PAD LRG (UNDERPADS AND DIAPERS) ×2 IMPLANT
CANISTER SUCT 1200ML W/VALVE (MISCELLANEOUS) ×2 IMPLANT
COVER BACK TABLE 60X90IN (DRAPES) ×2 IMPLANT
COVER MAYO STAND STRL (DRAPES) ×2 IMPLANT
COVER WAND RF STERILE (DRAPES) ×2 IMPLANT
DECANTER SPIKE VIAL GLASS SM (MISCELLANEOUS) ×2 IMPLANT
DRAPE HYSTEROSCOPY (MISCELLANEOUS) IMPLANT
DRAPE LAPAROTOMY 100X72 PEDS (DRAPES) ×2 IMPLANT
DRAPE SHEET LG 3/4 BI-LAMINATE (DRAPES) IMPLANT
DRSG PAD ABDOMINAL 8X10 ST (GAUZE/BANDAGES/DRESSINGS) ×2 IMPLANT
ELECT NEEDLE TIP 2.8 STRL (NEEDLE) IMPLANT
ELECT REM PT RETURN 9FT ADLT (ELECTROSURGICAL) ×2
ELECTRODE REM PT RTRN 9FT ADLT (ELECTROSURGICAL) ×1 IMPLANT
FILTER STRAW (MISCELLANEOUS) ×2 IMPLANT
GLOVE SRG 8 PF TXTR STRL LF DI (GLOVE) ×1 IMPLANT
GLOVE SURG LTX SZ8 (GLOVE) ×2 IMPLANT
GLOVE SURG UNDER POLY LF SZ8 (GLOVE) ×2
GOWN STRL REUS W/TWL XL LVL3 (GOWN DISPOSABLE) ×2 IMPLANT
IV CATH PLACEMENT 20 GA (IV SOLUTION) ×2 IMPLANT
KIT SIGMOIDOSCOPE (SET/KITS/TRAYS/PACK) IMPLANT
KIT TURNOVER CYSTO (KITS) ×2 IMPLANT
LEGGING LITHOTOMY PAIR STRL (DRAPES) IMPLANT
NEEDLE HYPO 22GX1.5 SAFETY (NEEDLE) ×2 IMPLANT
NS IRRIG 500ML POUR BTL (IV SOLUTION) ×2 IMPLANT
PACK BASIN DAY SURGERY FS (CUSTOM PROCEDURE TRAY) ×2 IMPLANT
PAD PREP 24X48 CUFFED NSTRL (MISCELLANEOUS) IMPLANT
PENCIL SMOKE EVACUATOR (MISCELLANEOUS) ×2 IMPLANT
SCRUB TECHNI CARE 4 OZ NO DYE (MISCELLANEOUS) ×2 IMPLANT
SHEARS HARMONIC 9CM CVD (BLADE) IMPLANT
SURGILUBE 2OZ TUBE FLIPTOP (MISCELLANEOUS) ×2 IMPLANT
SUT CHROMIC 2 0 SH (SUTURE) ×2 IMPLANT
SUT CHROMIC 3 0 SH 27 (SUTURE) IMPLANT
SUT VIC AB 2-0 SH 27 (SUTURE) ×6
SUT VIC AB 2-0 SH 27XBRD (SUTURE) ×3 IMPLANT
SUT VIC AB 2-0 UR6 27 (SUTURE) ×12 IMPLANT
SUT VICRYL 0 UR6 27IN ABS (SUTURE) IMPLANT
SUT VICRYL AB 2 0 TIE (SUTURE) IMPLANT
SUT VICRYL AB 2 0 TIES (SUTURE)
SYR 20ML LL LF (SYRINGE) ×2 IMPLANT
SYR 27GX1/2 1ML LL SAFETY (SYRINGE) ×2 IMPLANT
SYR BULB IRRIG 60ML STRL (SYRINGE) ×2 IMPLANT
SYR CONTROL 10ML LL (SYRINGE) IMPLANT
TAPE CLOTH 3X10 TAN LF (GAUZE/BANDAGES/DRESSINGS) ×2 IMPLANT
TOWEL OR 17X26 10 PK STRL BLUE (TOWEL DISPOSABLE) ×2 IMPLANT
TRAY DSU PREP LF (CUSTOM PROCEDURE TRAY) ×2 IMPLANT
TUBE CONNECTING 12X1/4 (SUCTIONS) ×2 IMPLANT
UNDERPAD 30X36 HEAVY ABSORB (UNDERPADS AND DIAPERS) ×2 IMPLANT
YANKAUER SUCT BULB TIP NO VENT (SUCTIONS) ×2 IMPLANT

## 2020-12-24 NOTE — Anesthesia Postprocedure Evaluation (Signed)
Anesthesia Post Note  Patient: Scott Stephenson  Procedure(s) Performed: EXAM UNDER ANESTHESIA WITH HEMORRHOIDECTOMY WITH LIGATION AND HEMORRHOIDOPEXY x3 (Rectum)     Patient location during evaluation: PACU Anesthesia Type: General Level of consciousness: sedated Pain management: pain level controlled Vital Signs Assessment: post-procedure vital signs reviewed and stable Respiratory status: spontaneous breathing and respiratory function stable Cardiovascular status: stable Postop Assessment: no apparent nausea or vomiting Anesthetic complications: no   No notable events documented.  Last Vitals:  Vitals:   12/24/20 1130 12/24/20 1139  BP: (!) 141/83 140/83  Pulse: 82 76  Resp: 10 (!) 8  Temp:  36.4 C  SpO2: 96% 97%    Last Pain:  Vitals:   12/24/20 1130  TempSrc:   PainSc: 3                  Estephania Licciardi DANIEL

## 2020-12-24 NOTE — Transfer of Care (Signed)
Immediate Anesthesia Transfer of Care Note  Patient: Scott Stephenson  Procedure(s) Performed: Procedure(s) (LRB): EXAM UNDER ANESTHESIA WITH HEMORRHOIDECTOMY WITH LIGATION AND HEMORRHOIDOPEXY x3 (N/A)  Patient Location: PACU  Anesthesia Type: General  Level of Consciousness: awake, oriented, sedated and patient cooperative  Airway & Oxygen Therapy: Patient Spontanous Breathing and Patient connected to face mask oxygen  Post-op Assessment: Report given to PACU RN and Post -op Vital signs reviewed and stable  Post vital signs: Reviewed and stable  Complications: No apparent anesthesia complications  Last Vitals:  Vitals Value Taken Time  BP 142/87 12/24/20 1111  Temp    Pulse 85 12/24/20 1115  Resp 16 12/24/20 1115  SpO2 99 % 12/24/20 1115  Vitals shown include unvalidated device data.  Last Pain:  Vitals:   12/24/20 0859  TempSrc: Oral  PainSc: 0-No pain      Patients Stated Pain Goal: 5 (12/24/20 0859)  Complications: No notable events documented.

## 2020-12-24 NOTE — Discharge Instructions (Addendum)
ANORECTAL SURGERY:  POST OPERATIVE INSTRUCTIONS  ######################################################################  EAT Start with a pureed / full liquid diet After 24 hours, gradually transition to a high fiber diet.    CONTROL PAIN Control pain so you can tolerate bowel movements,  walk, sleep, tolerate sneezing/coughing, and go up/down stairs.   HAVE A BOWEL MOVEMENT DAILY Keep your bowels regular to avoid problems.   Taking a fiber supplement every day to keep bowels soft.   Try a laxative to override constipation. Use an antidairrheal to slow down diarrhea.   Call if not better after 2 tries  WALK Walk an hour a day.  Control your pain to do that.   CALL IF YOU HAVE PROBLEMS/CONCERNS Call if you are still struggling despite following these instructions. Call if you have concerns not answered by these instructions  ######################################################################    Take your usually prescribed home medications unless otherwise directed.  DIET: Follow a light bland diet & liquids the first 24 hours after arrival home, such as soup, liquids, starches, etc.  Be sure to drink plenty of fluids.  Quickly advance to a usual solid diet within a few days.  Avoid fast food or heavy meals as your are more likely to get nauseated or have irregular bowels.  A low-fat, high-fiber diet for the rest of your life is ideal.  PAIN CONTROL: Pain is best controlled by a usual combination of three different methods TOGETHER: Ice/Heat Over the counter pain medication Prescription pain medication Expect swelling and discomfort in the anus/rectal area.  Warm water baths (30-60 minutes up to 6 times a day, especially after bowel meovements) will help. Use ice for the first few days to help decrease swelling and bruising, then switch to heat such as warm towels, sitz baths, warm baths, etc to help relax tight/sore spots and speed recovery.  Some people prefer to use ice  alone, heat alone, alternating between ice & heat.  Experiment to what works for you.   It is helpful to take an over-the-counter pain medication continuously for the first few weeks.  Choose one of the following that works best for you: Naproxen (Aleve, etc)  Two 220mg  tabs twice a day Ibuprofen (Advil, etc) Three 200mg  tabs four times a day (every meal & bedtime)  -- Next dose of Naproxen or Ibuprofen after 3:15 PM today if needed.  Acetaminophen (Tylenol, etc) 500-650mg  four times a day (every meal & bedtime) -- Next dose after 3:15 PM today if needed.  A  prescription for pain medication (such as oxycodone, hydrocodone, etc) should be given to you upon discharge.  Take your pain medication as prescribed.  If you are having problems/concerns with the prescription medicine (does not control pain, nausea, vomiting, rash, itching, etc), please call [phone] to see if we need to switch you to a different pain medicine that will work better for you and/or control your side effect better. If you need a refill on your pain medication, please contact your pharmacy.  They will contact our office to request authorization. Prescriptions will not be filled after 5 pm or on week-ends.  If can take up to 48 hours for it to be filled & ready so avoid waiting until you are down to thel ast pill. A topical cream (Dibucaine) or a prescription for a cream (such as diltiazem 2% gel) may be given to you.  Many people find relief with topical creams.  Some people find it burns too much.  Experiment.  If it  helps, use it.  If it burns, don't using it.  Use a Sitz Bath 4-8 times a day for relief   [employer] A sitz bath is a warm water bath taken in the sitting position that covers only the hips and buttocks. It may be used for either healing or hygiene purposes. Sitz baths are also used to relieve pain, itching, or muscle spasms. The water may contain medicine. Moist heat will help you heal and relax.  HOME CARE  INSTRUCTIONS  Take 3 to 4 sitz baths a day. Fill the bathtub half full with warm water. Sit in the water and open the drain a little. Turn on the warm water to keep the tub half full. Keep the water running constantly. Soak in the water for 15 to 20 minutes. After the sitz bath, pat the affected area dry first.   KEEP YOUR BOWELS REGULAR The goal is one soft bowel movement a day Avoid getting constipated.  Between the surgery and the pain medications, it is common to experience some constipation.  Increasing fluid intake and taking a fiber supplement (such as Metamucil, Citrucel, FiberCon, MiraLax, etc) 2-3 times a day regularly will usually help prevent this problem from occurring.  A mild laxative (prune juice, Milk of Magnesia, MiraLax, etc) should be taken according to package directions if there are no bowel movements after 48 hours. Watch out for diarrhea.  If you have many loose bowel movements, simplify your diet to bland foods & liquids for a few days.  Stop any stool softeners and decrease your fiber supplement.  Switching to mild anti-diarrheal medications (Kayopectate, Pepto Bismol) can help.  Can try an imodium/loperamide dose.  If this worsens or does not improve, please call us.  Wound Care  Remove your bandages with your first bowel movement, usually the day after surgery.  Let the gauze fall off with the first bowel movement or shower.   Wear an absorbent pad or soft cotton balls in your underwear as needed to catch any drainage and help keep the area  Keep the area clean and dry.  Bathe / shower every day.  Keep the area clean by showering / bathing over the incision / wound.   It is okay to soak an open wound to help wash it.  Consider using a squeeze bottle filled with warm water to gently wash the anal area.  Wet wipes or showers / gentle washing after bowel movements is often less traumatic than regular toilet paper. You will often notice bleeding with bowel movements.  This  should slow down by the end of the first week of surgery.  Sitting on an ice pack can help. Expect some drainage.  This should slow down by the end of the first week of surgery, but you will have occasional bleeding or drainage up to a few months after surgery.  Wear an absorbent pad or soft cotton gauze in your underwear until the drainage stops.  ACTIVITIES as tolerated:   You may resume regular (light) daily activities beginning the next day--such as daily self-care, walking, climbing stairs--gradually increasing activities as tolerated.  If you can walk 30 minutes without difficulty, it is safe to try more intense activity such as jogging, treadmill, bicycling, low-impact aerobics, swimming, etc. Save the most intensive and strenuous activity for last such as sit-ups, heavy lifting, contact sports, etc  Refrain from any heavy lifting or straining until you are off narcotics for pain control.   DO NOT PUSH THROUGH PAIN.  Let pain be your guide: If it hurts to do something, don't do it.  Pain is your body warning you to avoid that activity for another week until the pain goes down. You may drive when you are no longer taking prescription pain medication, you can comfortably sit for long periods of time, and you can safely maneuver your car and apply brakes. You may have sexual intercourse when it is comfortable.  FOLLOW UP in our office Please call [facility] at [phone] to set up an appointment to see your surgeon in the office for a follow-up appointment approximately 2-3 weeks after your surgery. Make sure that you call for this appointment the day you arrive home to ensure a convenient appointment time.  8. IF YOU HAVE DISABILITY OR FAMILY LEAVE FORMS, BRING THEM TO THE OFFICE FOR PROCESSING.  DO NOT GIVE THEM TO YOUR DOCTOR.    WHEN TO CALL US [phone]: Poor pain control Reactions / problems with new medications (rash/itching, nausea, etc)  Fever over 101.5 F (38.5 C) Inability to  urinate Nausea and/or vomiting Worsening swelling or bruising Continued bleeding from incision. Increased pain, redness, or drainage from the incision  The clinic staff is available to answer your questions during regular business hours (8:30am-5pm).  Please don't hesitate to call and ask to speak to one of our nurses for clinical concerns.   [facility] is always on call at the hospitals   If you have a medical emergency, go to the nearest emergency room or call 911.    [facility] [address], [address], [place], [place]  [address] ? MAIN: [phone] ? TOLL FREE: [phone] ? FAX [phone] [email or web address]    HEMORRHOIDS   Hemorrhoidal piles are natural clusters of blood vessels that help the rectum and anal canal stretch to hold stool and allow bowel movements.  Most people will develop a flare of hemorrhoids in their lifetime.  When hemorrhoidals are irritated, they can swell, burn, itch, cause pain, and bleed.  Most flares will calm down gradually within a few weeks.  However, once hemorrhoids are created, they tend to flare more easily.  Fortunately, good habits and simple medical treatment usually control hemorrhoids well, and surgery is needed only in severe cases.  TREATMENT OF HEMORRHOID FLARE Warm soaks. 4-8 times a day This helps more than any topical medication.   A sitz bath is a warm water bath taken in the sitting position that covers only the hips and buttocks.Fill the bathtub half full with warm water. Soak in the water for 15 to 30 minutes. After the sitz bath, pat the affected area dry first.  Normalize your bowels.  Extremes of diarrhea or constipation will make hemorrhoids worse.  One soft bowel movement a day is the goal.   Wet wipes instead of toilet paper Pain control with a NSAID such as ibuprofen (Advil) or naproxen (Aleve) or acetaminophen (Tylenol) around the clock.  Narcotics are  constipating and should be minimized if possible Topical creams contain steroids (bydrocortisone) or local anesthetic (xylocaine) can help make pain and itching more tolerable.    TROUBLESHOOTING IRREGULAR BOWELS 1) Avoid extremes of bowel movements (no bad constipation/diarrhea) 2) Miralax 17gm in 8oz. water or juice every day. May use twice a day.  3) Gas-x or Phazyme as needed for gas & bloating.  4) Soft & bland diet. No spicy, greasy, or fried foods.  5) Omeprazole over-the-counter as needed  6)  May hold gluten/wheat products from diet to see if symptoms improve.  7)  May try probiotics (Align, Activa, etc) to help calm the bowels down 8) If symptoms become worse: Call back immediately.   Post Anesthesia Home Care Instructions  Activity: Get plenty of rest for the remainder of the day. A responsible individual must stay with you for 24 hours following the procedure.  For the next 24 hours, DO NOT: -Drive a car -Advertising copywriter -Drink alcoholic beverages -Take any medication unless instructed by your physician -Make any legal decisions or sign important papers.  Meals: Start with liquid foods such as gelatin or soup. Progress to regular foods as tolerated. Avoid greasy, spicy, heavy foods. If nausea and/or vomiting occur, drink only clear liquids until the nausea and/or vomiting subsides. Call your physician if vomiting continues.  Special Instructions/Symptoms: Your throat may feel dry or sore from the anesthesia or the breathing tube placed in your throat during surgery. If this causes discomfort, gargle with warm salt water. The discomfort should disappear within 24 hours.     Information for Discharge Teaching: EXPAREL (bupivacaine liposome injectable suspension)   Your surgeon or anesthesiologist gave you EXPAREL(bupivacaine) to help control your pain after surgery.  EXPAREL is a local anesthetic that provides pain relief by numbing the tissue around the surgical  site. EXPAREL is designed to release pain medication over time and can control pain for up to 72 hours. Depending on how you respond to EXPAREL, you may require less pain medication during your recovery.  Possible side effects: Temporary loss of sensation or ability to move in the area where bupivacaine was injected. Nausea, vomiting, constipation Rarely, numbness and tingling in your mouth or lips, lightheadedness, or anxiety may occur. Call your doctor right away if you think you may be experiencing any of these sensations, or if you have other questions regarding possible side effects.  Follow all other discharge instructions given to you by your surgeon or nurse. Eat a healthy diet and drink plenty of water or other fluids.  If you return to the hospital for any reason within 96 hours following the administration of EXPAREL, it is important for health care providers to know that you have received this anesthetic. A teal colored band has been placed on your arm with the date, time and amount of EXPAREL you have received in order to alert and inform your health care providers. Please leave this armband in place for the full 96 hours following administration, and then you may remove the band.

## 2020-12-24 NOTE — Anesthesia Procedure Notes (Signed)
Procedure Name: Intubation Date/Time: 12/24/2020 9:53 AM Performed by: Suan Halter, CRNA Pre-anesthesia Checklist: Patient identified, Emergency Drugs available, Suction available and Patient being monitored Patient Re-evaluated:Patient Re-evaluated prior to induction Oxygen Delivery Method: Circle system utilized Preoxygenation: Pre-oxygenation with 100% oxygen Induction Type: IV induction Ventilation: Mask ventilation without difficulty Laryngoscope Size: Mac and 4 Grade View: Grade I Tube type: Oral Tube size: 7.0 mm Number of attempts: 1 Airway Equipment and Method: Stylet and Oral airway Placement Confirmation: ETT inserted through vocal cords under direct vision, positive ETCO2 and breath sounds checked- equal and bilateral Secured at: 23 cm Tube secured with: Tape Dental Injury: Teeth and Oropharynx as per pre-operative assessment

## 2020-12-24 NOTE — Op Note (Signed)
12/24/2020  10:59 AM  PATIENT:  Scott Stephenson  50 y.o. male  Patient Care Team: Wilfrid Lund, PA as PCP - General (Family Medicine) Karie Soda, MD as Consulting Physician (General Surgery) Kathi Der, MD as Consulting Physician (Gastroenterology)  PRE-OPERATIVE DIAGNOSIS:  HEMORRHOIDS PROLAPSING GRADE 3 WITH BLEEDING  POST-OPERATIVE DIAGNOSIS:  HEMORRHOIDS PROLAPSING GRADE 3 WITH BLEEDING  PROCEDURE:  Procedure(s): EXAM UNDER ANESTHESIA WITH HEMORRHOIDECTOMY WITH LIGATION AND HEMORRHOIDOPEXY x3  Internal and external hemorrhoidectomy x3 Internal hemorrhoidal ligation and pexy Anorectal examination under anesthesia  SURGEON:  [name], MD  ANESTHESIA:   General Anorectal & Local field block (0.25% bupivacaine with epinephrine mixed with Liposomal bupivacaine (Experel)   EBL:  Total I/O In: 400 [I.V.:200; IV Piggyback:200] Out: 10 [Blood:10].  See operative record  Delay start of Pharmacological VTE agent (>24hrs) due to surgical blood loss or risk of bleeding:  NO  DRAINS: NONE  SPECIMEN:   Internal & external hemorrhoid x3  DISPOSITION OF SPECIMEN:  PATHOLOGY  COUNTS:  YES  PLAN OF CARE: Discharge home after PACU  PATIENT DISPOSITION:  PACU - hemodynamically stable.  INDICATION: Pleasant patient with struggles with hemorrhoids.  Not able to be managed in the office despite an improved bowel regimen.  I recommended examination under anesthesia and surgical treatment:  The anatomy & physiology of the anorectal region was discussed.  The pathophysiology of hemorrhoids and differential diagnosis was discussed.  Natural history risks without surgery was discussed.   I stressed the importance of a bowel regimen to have daily soft bowel movements to minimize progression of disease.  Interventions such as sclerotherapy & banding were discussed.  The patient's symptoms are not adequately controlled by medicines and other non-operative treatments.  I feel  the risks & problems of no surgery outweigh the operative risks; therefore, I recommended surgery to treat the hemorrhoids by ligation, pexy, and possible resection.  Risks such as bleeding, infection, need for further treatment, heart attack, death, and other risks were discussed.   I noted a good likelihood this will help address the problem.  Goals of post-operative recovery were discussed as well.  Possibility that this will not correct all symptoms was explained.  Post-operative pain, bleeding, constipation, urinary difficulties, and other problems after surgery were discussed.  We will work to minimize complications.   Educational handouts further explaining the pathology, treatment options, and bowel regimen were given as well.  Questions were answered.  The patient expresses understanding & wishes to proceed with surgery.  OR FINDINGS: Enlarged internal/external hemorrhoids.  Left lateral grade 4 with significant external component.  Dominant right lateral internal grade 3/4 hemorrhoid as well.  Right posterior external hemorrhoidal anal tag -excised.  Not classic for sentinel tag nor prior fissure.  Right anterior grade 2/3.  Ligation pexy done at that location.  DESCRIPTION:   Informed consent was confirmed. Patient underwent general anesthesia without difficulty. Patient was placed into prone positioning.  The perianal region was prepped and draped in sterile fashion. Surgical time-out confirmed our plan.  I did digital rectal examination and then transitioned over to anoscopy to get a sense of the anatomy.  Findings noted above.   I proceeded to do hemorrhoidal ligation and pexy.  I used a 2-0 Vicryl suture on a UR-6 needle in a figure-of-eight fashion 6 cm proximal to the anal verge.  I started at the largest hemorrhoid pile.  Because of redundant hemorrhoidal tissue too bulky to merely ligate or pexy, I excised the excess  internal hemorrhoid piles longitudinally in a fusiform biconcave  fashion, sparing the anal canal to avoid narrowing.  I then ran that stitch longitudinally more distally to close the hemorrhoidectomy wound to the anal verge over a large Parks self-retaining anal retractor to avoid narrowing of the anal canal.  I then tied that stitch down to cause a hemorrhoidopexy.   I also had to do an excision at the   right lateral and left lateral pile locations.  I then did hemorrhoidal ligation and pexy at the other 4 hemorrhoidal columns.  Had a external hemorrhoidal tag in the right posterior column that I trimmed off as well.  At the completion of this, all 6 anorectal columns were ligated and pexied in the classic hexagonal fashion (right anterior/lateral/posterior, left anterior/lateral/posterior).  I closed the external part of the hemorrhoidectomy wounds with radial interrupted horizontal mattress 2-0 chromic suture over a large Sawyer anal retractor, leaving the last 5 mm open to allow natural drainage.    I redid anoscopy & examination.  At completion of this, all hemorrhoids had been removed or reduced into the rectum.  There is no more prolapse.  Internal & external anatomy was more more normal.  Hemostasis was good.  Fluffed gauze was on-laid over the perianal region.  No packing done.  Patient is being extubated go to go to the recovery room.  I had discussed postop care in detail with the patient in the preop holding area.  Instructions for post-operative recovery and prescriptions are written. I discussed operative findings, updated the patient's status, discussed probable steps to recovery, and gave postoperative recommendations to the patient's spouse, Bunnie Lederman.  Recommendations were made.  Questions were answered.  She expressed understanding & appreciation.  [name], M.D., F.A.C.S. Gastrointestinal and Minimally Invasive Surgery [facility] [address], [address] [place], [place] [address] [phone] Main /  Paging

## 2020-12-24 NOTE — H&P (Signed)
12/24/2020  Scott Stephenson  DOB: 27-Aug-1970 Married / Language: [place] / Race: White Male   Patient Care Team: Wilfrid Lund, PA as PCP - General (Family Medicine) Karie Soda, MD as Consulting Physician (General Surgery) Kathi Der, MD as Consulting Physician (Gastroenterology)  ` Patient sent for surgical consultation at the request of Dr Moss Mc [facility]   Chief Complaint: Rectal pain and hemorrhoids. ` ` The patient is a gentleman struggle with anal issues for many years.  Thinks her hemorrhoids.  He's had intermittent episodes of popping and swelling.  Can often feel it on the outside.  Had more challenging flares last year.  Has tried to adjust his diet.  Eating better with higher fiber.  Still has occasionally hard stools.  Usually has 1 or 2 a day.  Has had worsening intermittent rectal bleeding.  Had a colonoscopy.  I cannot get those records.  He recalls being told it was rather underwhelming aside from hemorrhoids.  He has significant hemorrhoids of bleeding, surgical consultation recommended.  Patient will have a without difficulty.  He's never had any abdominal surgeries.  No history of stroke or heart attack.  No diabetes or sleep apnea.  Does not smoke.   No personal nor family history of GI/colon cancer, inflammatory bowel disease, irritable bowel syndrome, allergy such as Celiac Sprue, dietary/dairy problems, colitis, ulcers nor gastritis.  No recent sick contacts/gastroenteritis.  No travel outside the country.  No changes in diet.  No dysphagia to solids or liquids.  No significant heartburn or reflux.  No melena, hematemesis, coffee ground emesis.  No evidence of prior gastric/peptic ulceration.  Ready for surgery   (Review of systems as stated in this history (HPI) or in the review of systems.  Otherwise all other 12 point ROS are negative) ` ` ###########################################`   This patient encounter took 30 minutes today to perform the  following: obtain history, perform exam, review outside records, interpret tests & imaging, counsel the patient on their diagnosis; and, document this encounter, including findings & plan in the electronic health record (EHR).     Past Surgical History [name], CMA; 10/19/2020 3:21 PM) Colon Polyp Removal - Colonoscopy  Oral Surgery  Tonsillectomy  Vasectomy    Diagnostic Studies History [name] Spillers, CMA; 10/19/2020 3:21 PM) Colonoscopy  within last year   Allergies [name] Spillers, CMA; 10/19/2020 3:21 PM) No Known Drug Allergies  [10/19/2020]:   Medication History [name], CMA; 10/19/2020 3:21 PM) No Current Medications  Medications Reconciled    Social History [name], CMA; 10/19/2020 3:21 PM) Alcohol use  Occasional alcohol use. Caffeine use  Carbonated beverages. No drug use  Tobacco use  Never smoker.   Family History [name], CMA; 10/19/2020 3:21 PM) Cancer  Father, Mother. Depression  Son. Migraine Headache  Mother.   Other Problems [name] Spillers, CMA; 10/19/2020 3:21 PM) Back Pain  Enlarged Prostate  Gastroesophageal Reflux Disease  Hemorrhoids  Hypercholesterolemia  Kidney Stone          Review of Systems  General Not Present- Appetite Loss, Chills, Fatigue, Fever, Night Sweats, Weight Gain and Weight Loss. Skin Not Present- Change in Wart/Mole, Dryness, Hives, Jaundice, New Lesions, Non-Healing Wounds, Rash and Ulcer. HEENT Present- Ringing in the Ears, Seasonal Allergies and Wears glasses/contact lenses. Not Present- Earache, Hearing Loss, Hoarseness, Nose Bleed, Oral Ulcers, Sinus Pain, Sore Throat, Visual Disturbances and Yellow Eyes. Respiratory Not Present- Bloody sputum, Chronic Cough, Difficulty Breathing, Snoring and Wheezing. Breast Not Present- Breast Mass,  Breast Pain, Nipple Discharge and Skin Changes. Cardiovascular Not Present- Chest Pain, Difficulty Breathing Lying Down, Leg Cramps, Palpitations, Rapid  Heart Rate, Shortness of Breath and Swelling of Extremities. Gastrointestinal Present- Hemorrhoids. Not Present- Abdominal Pain, Bloating, Bloody Stool, Change in Bowel Habits, Chronic diarrhea, Constipation, Difficulty Swallowing, Excessive gas, Gets full quickly at meals, Indigestion, Nausea, Rectal Pain and Vomiting. Male Genitourinary Not Present- Blood in Urine, Change in Urinary Stream, Frequency, Impotence, Nocturia, Painful Urination, Urgency and Urine Leakage. Musculoskeletal Not Present- Back Pain, Joint Pain, Joint Stiffness, Muscle Pain, Muscle Weakness and Swelling of Extremities. Neurological Not Present- Decreased Memory, Fainting, Headaches, Numbness, Seizures, Tingling, Tremor, Trouble walking and Weakness. Psychiatric Not Present- Anxiety, Bipolar, Change in Sleep Pattern, Depression, Fearful and Frequent crying. Endocrine Not Present- Cold Intolerance, Excessive Hunger, Hair Changes, Heat Intolerance, Hot flashes and New Diabetes. Hematology Not Present- Blood Thinners, Easy Bruising, Excessive bleeding, Gland problems, HIV and Persistent Infections.   Vitals ([name] CMA; 10/19/2020 3:21 PM) 10/19/2020 3:21 PM Weight: 217.2 lb   Height: 71 in  Body Surface Area: 2.18 m   Body Mass Index: 30.29 kg/m   Pulse: 62 (Regular)    BP: 110/64(Sitting, Left Arm, Standard)      BP 127/81   Pulse 65   Temp 98.4 F (36.9 C) (Oral)   Resp 17   Ht 5\' 11"  (1.803 m)   Wt 96.6 kg   SpO2 98%   BMI 29.69 kg/m  12/24/2020          Physical Exam   General Mental Status - Alert. General Appearance - Not in acute distress, Not Sickly. Orientation - Oriented X3. Hydration - Well hydrated. Voice - Normal.   Integumentary Global Assessment Upon inspection and palpation of skin surfaces of the - Axillae: non-tender, no inflammation or ulceration, no drainage. and Distribution of scalp and body hair is normal. General Characteristics Temperature - normal warmth is noted.    Head and Neck Head - normocephalic, atraumatic with no lesions or palpable masses. Face Global Assessment - atraumatic, no absence of expression. Neck Global Assessment - no abnormal movements, no bruit auscultated on the right, no bruit auscultated on the left, no decreased range of motion, non-tender. Trachea - midline. Thyroid Gland Characteristics - non-tender.   Eye Eyeball - Left - Extraocular movements intact, No Nystagmus - Left. Eyeball - Right - Extraocular movements intact, No Nystagmus - Right. Cornea - Left - No Hazy - Left. Cornea - Right - No Hazy - Right. Sclera/Conjunctiva - Left - No scleral icterus, No Discharge - Left. Sclera/Conjunctiva - Right - No scleral icterus, No Discharge - Right. Pupil - Left - Direct reaction to light normal. Pupil - Right - Direct reaction to light normal.   ENMT Ears Pinna - Left - no drainage observed, no generalized tenderness observed. Pinna - Right - no drainage observed, no generalized tenderness observed. Nose and Sinuses External Inspection of the Nose - no destructive lesion observed. Inspection of the nares - Left - quiet respiration. Inspection of the nares - Right - quiet respiration. Mouth and Throat Lips - Upper Lip - no fissures observed, no pallor noted. Lower Lip - no fissures observed, no pallor noted. Nasopharynx - no discharge present. Oral Cavity/Oropharynx - Tongue - no dryness observed. Oral Mucosa - no cyanosis observed. Hypopharynx - no evidence of airway distress observed.   Chest and Lung Exam Inspection Movements - Normal and Symmetrical. Accessory muscles - No use of accessory muscles in breathing. Palpation Palpation of  the chest reveals - Non-tender. Auscultation Breath sounds - Normal and Clear.   Cardiovascular Auscultation Rhythm - Regular. Murmurs & Other Heart Sounds - Auscultation of the heart reveals - No Murmurs and No Systolic Clicks.   Abdomen Inspection Inspection of the abdomen  reveals - No Visible peristalsis and No Abnormal pulsations. Umbilicus - No Bleeding, No Urine drainage. Palpation/Percussion Palpation and Percussion of the abdomen reveal - Soft, Non Tender, No Rebound tenderness, No Rigidity (guarding) and No Cutaneous hyperesthesia. Note:  Abdomen soft.  Nontender.  Not distended.  No umbilical or incisional hernias.  No guarding.   Male Genitourinary Sexual Maturity Tanner 5 - Adult hair pattern and Adult penile size and shape. Note:  No inguinal hernias. Normal external genitalia. Epididymi, testes, and spermatic cords normal without any masses.   Rectal Note:  ###########################   Please refer to anoscopy.   Left lateral grade 3/4 prolapsed hemorrhoid friable. Large right posterior internal hemorrhoid at least grade 2 probably grade 3. Right anterior grade 2.   No fissure or fistula or abscess. A few external hemorrhoidal tags. Thin but intact anal sphincter. No proctitis obvious. No condyloma. Prostate smooth and not enlarged.   Peripheral Vascular Upper Extremity Inspection - Left - No Cyanotic nailbeds - Left, Not Ischemic. Inspection - Right - No Cyanotic nailbeds - Right, Not Ischemic.   Neurologic Neurologic evaluation reveals  - normal attention span and ability to concentrate, able to name objects and repeat phrases. Appropriate fund of knowledge , normal sensation and normal coordination. Mental Status Affect - not angry, not paranoid. Cranial Nerves - Normal Bilaterally. Gait - Normal.   Neuropsychiatric Mental status exam performed with findings of - able to articulate well with normal speech/language, rate, volume and coherence, thought content normal with ability to perform basic computations and apply abstract reasoning and no evidence of hallucinations, delusions, obsessions or homicidal/suicidal ideation.   Musculoskeletal Global Assessment Spine, Ribs and Pelvis - no instability, subluxation or laxity. Right Upper  Extremity - no instability, subluxation or laxity.   Lymphatic Head & Neck   General Head & Neck Lymphatics: Bilateral - Description - No Localized lymphadenopathy. Axillary   General Axillary Region: Bilateral - Description - No Localized lymphadenopathy. Femoral & Inguinal   Generalized Femoral & Inguinal Lymphatics: Left - Description - No Localized lymphadenopathy. Right - Description - No Localized lymphadenopathy.     Results (Procedures   Name Value Date Hemorrhoids Procedure   Anal exam: External Hemorrhoid   Internal exam:   Internal Hemorroids ( non-bleeding)   Internal Hemorrhoids (bleeding)   prolapse   Other: Left lateral grade 3/4 prolapsed hemorrhoid friable.  Large right posterior internal hemorrhoid at least grade 2 probably grade 3.  Right anterior grade 2............Marland KitchenNo fissure or fistula or abscess.  A few external hemorrhoidal tags.  Thin but intact anal sphincter.  No proctitis obvious.  No condyloma.  Prostate smooth and not enlarged.          Assessment & Plan    PROLAPSED INTERNAL HEMORRHOIDS, GRADE 3 (K64.2) Impression: Large internal hemorrhoids with left lateral partially prolapsed. Right posterior rather large and easily prolapsing. Still struggled with intermittent flares of bleeding and discomfort despite good diet bowel regimen and relatively improved bowel function.   At the very least we could offer her series of banding spelled I suspect hemorrhoid ligation Paxene hemorrhoidectomy would be a day more aggressive and appropriate option. He would like to consider hemorrhoidectomy to get his anatomy after better control. He wants  double check about insurance coverage and postoperative recovery. Work to coordinate a convenient time.      PROLAPSED INTERNAL HEMORRHOIDS, GRADE 2 (K64.1) Impression: The anatomy & physiology of the anorectal region was discussed. The pathophysiology of hemorrhoids and differential diagnosis was discussed.  Natural history progression was discussed. I stressed the importance of a bowel regimen to have daily soft bowel movements to minimize progression of disease. Goal of one BM / day ideal. Use of wet wipes, warm baths, avoiding straining, etc were emphasized.   Educational handouts further explaining the pathology, treatment options, and bowel regimen were given as well. The patient expressed understanding.     EXTERNAL HEMORRHOIDS WITH COMPLICATION (K64.4)     The anatomy & physiology of the anorectal region was discussed.  The pathophysiology of hemorrhoids and differential diagnosis was discussed.  Natural history risks without surgery was discussed.   I stressed the importance of a bowel regimen to have daily soft bowel movements to minimize progression of disease.  Interventions such as sclerotherapy & banding were discussed.   The patient's symptoms are not adequately controlled by medicines and other non-operative treatments.  I feel the risks & problems of no surgery outweigh the operative risks; therefore, I recommended surgery to treat the hemorrhoids by ligation, pexy, and possible resection.    Risks such as bleeding, infection, urinary difficulties, need for further treatment, heart attack, death, and other risks were discussed.   I noted a good likelihood this will help address the problem.  Goals of post-operative recovery were discussed as well.  Possibility that this will not correct all symptoms was explained.  Post-operative pain, bleeding, constipation, and other problems after surgery were discussed.  We will work to minimize complications.   Educational handouts further explaining the pathology, treatment options, and bowel regimen were given as well.  Questions were answered.  The patient expresses understanding & wishes to proceed with surgery.   Pt Education - [facility] Rectal Prep for Anorectal outpatient/office surgery: discussed with patient and provided information. Pt Education -  [facility] Rectal Surgery HCI (Ozetta Flatley): discussed with patient and provided information.   [name], MD, FACS, MASCRS Esophageal, Gastrointestinal & Colorectal Surgery Robotic and Minimally Invasive Surgery  [facility] [address] [place], [place] [address] [phone] Fax [phone] Main/Paging  CONTACT INFORMATION: Weekday (9AM-5PM) concerns: Call [facility] main office at [phone] Weeknight (5PM-9AM) or Weekend/Holiday concerns: Check [email or web address] for General Surgery [facility] coverage (Please, do not use SecureChat as it is not reliable communication to operating surgeons for immediate patient care)      12/24/2020

## 2020-12-25 ENCOUNTER — Encounter (HOSPITAL_BASED_OUTPATIENT_CLINIC_OR_DEPARTMENT_OTHER): Payer: Self-pay | Admitting: Surgery

## 2020-12-25 LAB — SURGICAL PATHOLOGY

## 2021-01-25 ENCOUNTER — Ambulatory Visit: Payer: Self-pay | Admitting: Surgery

## 2021-04-22 DIAGNOSIS — H60391 Other infective otitis externa, right ear: Secondary | ICD-10-CM | POA: Diagnosis not present

## 2021-04-22 DIAGNOSIS — H0100B Unspecified blepharitis left eye, upper and lower eyelids: Secondary | ICD-10-CM | POA: Diagnosis not present

## 2021-07-13 DIAGNOSIS — H029 Unspecified disorder of eyelid: Secondary | ICD-10-CM | POA: Diagnosis not present

## 2021-07-13 DIAGNOSIS — H9391 Unspecified disorder of right ear: Secondary | ICD-10-CM | POA: Diagnosis not present

## 2021-08-10 DIAGNOSIS — L280 Lichen simplex chronicus: Secondary | ICD-10-CM | POA: Diagnosis not present

## 2021-10-07 DIAGNOSIS — L280 Lichen simplex chronicus: Secondary | ICD-10-CM | POA: Diagnosis not present

## 2021-10-13 DIAGNOSIS — M79672 Pain in left foot: Secondary | ICD-10-CM | POA: Diagnosis not present

## 2021-11-07 DIAGNOSIS — J069 Acute upper respiratory infection, unspecified: Secondary | ICD-10-CM | POA: Diagnosis not present

## 2021-11-10 DIAGNOSIS — H524 Presbyopia: Secondary | ICD-10-CM | POA: Diagnosis not present

## 2022-03-03 DIAGNOSIS — Z6832 Body mass index (BMI) 32.0-32.9, adult: Secondary | ICD-10-CM | POA: Diagnosis not present

## 2022-03-03 DIAGNOSIS — K219 Gastro-esophageal reflux disease without esophagitis: Secondary | ICD-10-CM | POA: Diagnosis not present

## 2022-03-03 DIAGNOSIS — Z1331 Encounter for screening for depression: Secondary | ICD-10-CM | POA: Diagnosis not present

## 2022-03-03 DIAGNOSIS — R7301 Impaired fasting glucose: Secondary | ICD-10-CM | POA: Diagnosis not present

## 2022-03-03 DIAGNOSIS — Z9189 Other specified personal risk factors, not elsewhere classified: Secondary | ICD-10-CM | POA: Diagnosis not present

## 2022-03-03 DIAGNOSIS — R7303 Prediabetes: Secondary | ICD-10-CM | POA: Diagnosis not present

## 2022-03-03 DIAGNOSIS — Z8639 Personal history of other endocrine, nutritional and metabolic disease: Secondary | ICD-10-CM | POA: Diagnosis not present

## 2022-03-03 DIAGNOSIS — E782 Mixed hyperlipidemia: Secondary | ICD-10-CM | POA: Diagnosis not present

## 2022-03-03 DIAGNOSIS — E559 Vitamin D deficiency, unspecified: Secondary | ICD-10-CM | POA: Diagnosis not present

## 2022-03-03 DIAGNOSIS — E8889 Other specified metabolic disorders: Secondary | ICD-10-CM | POA: Diagnosis not present

## 2022-03-03 DIAGNOSIS — R5383 Other fatigue: Secondary | ICD-10-CM | POA: Diagnosis not present

## 2022-03-17 DIAGNOSIS — M1990 Unspecified osteoarthritis, unspecified site: Secondary | ICD-10-CM | POA: Diagnosis not present

## 2022-03-17 DIAGNOSIS — K219 Gastro-esophageal reflux disease without esophagitis: Secondary | ICD-10-CM | POA: Diagnosis not present

## 2022-03-17 DIAGNOSIS — E559 Vitamin D deficiency, unspecified: Secondary | ICD-10-CM | POA: Diagnosis not present

## 2022-03-17 DIAGNOSIS — Z9189 Other specified personal risk factors, not elsewhere classified: Secondary | ICD-10-CM | POA: Diagnosis not present

## 2022-03-17 DIAGNOSIS — R7303 Prediabetes: Secondary | ICD-10-CM | POA: Diagnosis not present

## 2022-03-31 DIAGNOSIS — E559 Vitamin D deficiency, unspecified: Secondary | ICD-10-CM | POA: Diagnosis not present

## 2022-03-31 DIAGNOSIS — Z9189 Other specified personal risk factors, not elsewhere classified: Secondary | ICD-10-CM | POA: Diagnosis not present

## 2022-03-31 DIAGNOSIS — E669 Obesity, unspecified: Secondary | ICD-10-CM | POA: Diagnosis not present

## 2022-03-31 DIAGNOSIS — R7303 Prediabetes: Secondary | ICD-10-CM | POA: Diagnosis not present

## 2022-04-11 ENCOUNTER — Other Ambulatory Visit (HOSPITAL_COMMUNITY): Payer: Self-pay

## 2022-04-11 MED ORDER — WEGOVY 0.25 MG/0.5ML ~~LOC~~ SOAJ
SUBCUTANEOUS | 0 refills | Status: AC
  Filled 2022-04-11 – 2022-04-20 (×2): qty 2, 28d supply, fill #0

## 2022-04-20 ENCOUNTER — Other Ambulatory Visit (HOSPITAL_COMMUNITY): Payer: Self-pay

## 2022-04-26 DIAGNOSIS — K5730 Diverticulosis of large intestine without perforation or abscess without bleeding: Secondary | ICD-10-CM | POA: Diagnosis not present

## 2022-04-26 DIAGNOSIS — K219 Gastro-esophageal reflux disease without esophagitis: Secondary | ICD-10-CM | POA: Diagnosis not present

## 2022-04-26 DIAGNOSIS — Z8639 Personal history of other endocrine, nutritional and metabolic disease: Secondary | ICD-10-CM | POA: Diagnosis not present

## 2022-04-26 DIAGNOSIS — E782 Mixed hyperlipidemia: Secondary | ICD-10-CM | POA: Diagnosis not present

## 2022-05-11 DIAGNOSIS — Z9189 Other specified personal risk factors, not elsewhere classified: Secondary | ICD-10-CM | POA: Diagnosis not present

## 2022-05-11 DIAGNOSIS — Z6831 Body mass index (BMI) 31.0-31.9, adult: Secondary | ICD-10-CM | POA: Diagnosis not present

## 2022-05-11 DIAGNOSIS — E65 Localized adiposity: Secondary | ICD-10-CM | POA: Diagnosis not present

## 2022-05-11 DIAGNOSIS — E669 Obesity, unspecified: Secondary | ICD-10-CM | POA: Diagnosis not present

## 2022-05-11 DIAGNOSIS — R7303 Prediabetes: Secondary | ICD-10-CM | POA: Diagnosis not present

## 2022-05-26 DIAGNOSIS — N200 Calculus of kidney: Secondary | ICD-10-CM | POA: Diagnosis not present

## 2022-06-06 ENCOUNTER — Other Ambulatory Visit (HOSPITAL_COMMUNITY): Payer: Self-pay

## 2022-06-14 DIAGNOSIS — Z6831 Body mass index (BMI) 31.0-31.9, adult: Secondary | ICD-10-CM | POA: Diagnosis not present

## 2022-06-14 DIAGNOSIS — E669 Obesity, unspecified: Secondary | ICD-10-CM | POA: Diagnosis not present

## 2022-06-14 DIAGNOSIS — R7303 Prediabetes: Secondary | ICD-10-CM | POA: Diagnosis not present

## 2022-06-14 DIAGNOSIS — E65 Localized adiposity: Secondary | ICD-10-CM | POA: Diagnosis not present

## 2022-07-12 DIAGNOSIS — R454 Irritability and anger: Secondary | ICD-10-CM | POA: Diagnosis not present

## 2022-07-12 DIAGNOSIS — R7303 Prediabetes: Secondary | ICD-10-CM | POA: Diagnosis not present

## 2022-07-12 DIAGNOSIS — E669 Obesity, unspecified: Secondary | ICD-10-CM | POA: Diagnosis not present

## 2022-07-12 DIAGNOSIS — Z87442 Personal history of urinary calculi: Secondary | ICD-10-CM | POA: Diagnosis not present

## 2022-07-14 DIAGNOSIS — E559 Vitamin D deficiency, unspecified: Secondary | ICD-10-CM | POA: Diagnosis not present

## 2022-07-14 DIAGNOSIS — E669 Obesity, unspecified: Secondary | ICD-10-CM | POA: Diagnosis not present

## 2022-07-14 DIAGNOSIS — R454 Irritability and anger: Secondary | ICD-10-CM | POA: Diagnosis not present

## 2022-07-14 DIAGNOSIS — R7303 Prediabetes: Secondary | ICD-10-CM | POA: Diagnosis not present

## 2022-07-22 DIAGNOSIS — E559 Vitamin D deficiency, unspecified: Secondary | ICD-10-CM | POA: Diagnosis not present

## 2022-07-22 DIAGNOSIS — Z87442 Personal history of urinary calculi: Secondary | ICD-10-CM | POA: Diagnosis not present

## 2022-07-22 DIAGNOSIS — E669 Obesity, unspecified: Secondary | ICD-10-CM | POA: Diagnosis not present

## 2022-07-22 DIAGNOSIS — R7303 Prediabetes: Secondary | ICD-10-CM | POA: Diagnosis not present

## 2022-07-25 ENCOUNTER — Other Ambulatory Visit (HOSPITAL_COMMUNITY): Payer: Self-pay

## 2022-07-27 ENCOUNTER — Other Ambulatory Visit (HOSPITAL_COMMUNITY): Payer: Self-pay

## 2022-07-27 DIAGNOSIS — R7303 Prediabetes: Secondary | ICD-10-CM | POA: Diagnosis not present

## 2022-07-27 DIAGNOSIS — E65 Localized adiposity: Secondary | ICD-10-CM | POA: Diagnosis not present

## 2022-07-27 DIAGNOSIS — R031 Nonspecific low blood-pressure reading: Secondary | ICD-10-CM | POA: Diagnosis not present

## 2022-07-27 DIAGNOSIS — E663 Overweight: Secondary | ICD-10-CM | POA: Diagnosis not present

## 2022-08-16 DIAGNOSIS — Z8639 Personal history of other endocrine, nutritional and metabolic disease: Secondary | ICD-10-CM | POA: Diagnosis not present

## 2022-08-16 DIAGNOSIS — E663 Overweight: Secondary | ICD-10-CM | POA: Diagnosis not present

## 2022-08-16 DIAGNOSIS — R7303 Prediabetes: Secondary | ICD-10-CM | POA: Diagnosis not present

## 2022-08-16 DIAGNOSIS — E65 Localized adiposity: Secondary | ICD-10-CM | POA: Diagnosis not present

## 2022-09-14 DIAGNOSIS — R7303 Prediabetes: Secondary | ICD-10-CM | POA: Diagnosis not present

## 2022-09-14 DIAGNOSIS — E663 Overweight: Secondary | ICD-10-CM | POA: Diagnosis not present

## 2022-09-14 DIAGNOSIS — Z6828 Body mass index (BMI) 28.0-28.9, adult: Secondary | ICD-10-CM | POA: Diagnosis not present

## 2022-11-15 DIAGNOSIS — E663 Overweight: Secondary | ICD-10-CM | POA: Diagnosis not present

## 2022-11-15 DIAGNOSIS — R7303 Prediabetes: Secondary | ICD-10-CM | POA: Diagnosis not present

## 2023-01-10 DIAGNOSIS — E663 Overweight: Secondary | ICD-10-CM | POA: Diagnosis not present

## 2023-01-10 DIAGNOSIS — R7303 Prediabetes: Secondary | ICD-10-CM | POA: Diagnosis not present

## 2023-03-06 DIAGNOSIS — R7303 Prediabetes: Secondary | ICD-10-CM | POA: Diagnosis not present

## 2023-03-06 DIAGNOSIS — E663 Overweight: Secondary | ICD-10-CM | POA: Diagnosis not present

## 2023-05-02 DIAGNOSIS — E663 Overweight: Secondary | ICD-10-CM | POA: Diagnosis not present

## 2023-05-02 DIAGNOSIS — R7303 Prediabetes: Secondary | ICD-10-CM | POA: Diagnosis not present

## 2023-05-02 DIAGNOSIS — E782 Mixed hyperlipidemia: Secondary | ICD-10-CM | POA: Diagnosis not present

## 2023-05-02 DIAGNOSIS — E559 Vitamin D deficiency, unspecified: Secondary | ICD-10-CM | POA: Diagnosis not present

## 2023-05-03 DIAGNOSIS — H524 Presbyopia: Secondary | ICD-10-CM | POA: Diagnosis not present

## 2023-06-27 DIAGNOSIS — E782 Mixed hyperlipidemia: Secondary | ICD-10-CM | POA: Diagnosis not present

## 2023-06-27 DIAGNOSIS — Z125 Encounter for screening for malignant neoplasm of prostate: Secondary | ICD-10-CM | POA: Diagnosis not present

## 2023-06-27 DIAGNOSIS — M25851 Other specified joint disorders, right hip: Secondary | ICD-10-CM | POA: Diagnosis not present

## 2023-06-27 DIAGNOSIS — M25852 Other specified joint disorders, left hip: Secondary | ICD-10-CM | POA: Diagnosis not present

## 2023-06-27 DIAGNOSIS — Z0000 Encounter for general adult medical examination without abnormal findings: Secondary | ICD-10-CM | POA: Diagnosis not present

## 2023-07-26 DIAGNOSIS — M25851 Other specified joint disorders, right hip: Secondary | ICD-10-CM | POA: Diagnosis not present

## 2023-07-26 DIAGNOSIS — M25852 Other specified joint disorders, left hip: Secondary | ICD-10-CM | POA: Diagnosis not present

## 2023-08-16 DIAGNOSIS — E782 Mixed hyperlipidemia: Secondary | ICD-10-CM | POA: Diagnosis not present

## 2023-08-16 DIAGNOSIS — R7303 Prediabetes: Secondary | ICD-10-CM | POA: Diagnosis not present

## 2023-08-16 DIAGNOSIS — E559 Vitamin D deficiency, unspecified: Secondary | ICD-10-CM | POA: Diagnosis not present

## 2023-08-24 DIAGNOSIS — M25552 Pain in left hip: Secondary | ICD-10-CM | POA: Diagnosis not present

## 2023-10-02 DIAGNOSIS — E663 Overweight: Secondary | ICD-10-CM | POA: Diagnosis not present

## 2023-10-02 DIAGNOSIS — R7303 Prediabetes: Secondary | ICD-10-CM | POA: Diagnosis not present

## 2023-10-02 DIAGNOSIS — E782 Mixed hyperlipidemia: Secondary | ICD-10-CM | POA: Diagnosis not present

## 2023-10-02 DIAGNOSIS — E559 Vitamin D deficiency, unspecified: Secondary | ICD-10-CM | POA: Diagnosis not present

## 2023-12-05 DIAGNOSIS — M25851 Other specified joint disorders, right hip: Secondary | ICD-10-CM | POA: Diagnosis not present

## 2023-12-05 DIAGNOSIS — M25852 Other specified joint disorders, left hip: Secondary | ICD-10-CM | POA: Diagnosis not present

## 2023-12-05 DIAGNOSIS — M25561 Pain in right knee: Secondary | ICD-10-CM | POA: Diagnosis not present

## 2023-12-06 DIAGNOSIS — R7303 Prediabetes: Secondary | ICD-10-CM | POA: Diagnosis not present

## 2023-12-06 DIAGNOSIS — E559 Vitamin D deficiency, unspecified: Secondary | ICD-10-CM | POA: Diagnosis not present

## 2023-12-06 DIAGNOSIS — E663 Overweight: Secondary | ICD-10-CM | POA: Diagnosis not present

## 2023-12-06 DIAGNOSIS — E782 Mixed hyperlipidemia: Secondary | ICD-10-CM | POA: Diagnosis not present

## 2024-01-02 DIAGNOSIS — H209 Unspecified iridocyclitis: Secondary | ICD-10-CM | POA: Diagnosis not present

## 2024-02-13 DIAGNOSIS — M25552 Pain in left hip: Secondary | ICD-10-CM | POA: Diagnosis not present

## 2024-02-13 DIAGNOSIS — M25551 Pain in right hip: Secondary | ICD-10-CM | POA: Diagnosis not present

## 2024-03-04 DIAGNOSIS — M1612 Unilateral primary osteoarthritis, left hip: Secondary | ICD-10-CM | POA: Diagnosis not present

## 2024-03-04 DIAGNOSIS — M1611 Unilateral primary osteoarthritis, right hip: Secondary | ICD-10-CM | POA: Diagnosis not present

## 2024-03-25 DIAGNOSIS — E782 Mixed hyperlipidemia: Secondary | ICD-10-CM | POA: Diagnosis not present

## 2024-03-25 DIAGNOSIS — R7303 Prediabetes: Secondary | ICD-10-CM | POA: Diagnosis not present

## 2024-03-25 DIAGNOSIS — E559 Vitamin D deficiency, unspecified: Secondary | ICD-10-CM | POA: Diagnosis not present

## 2024-03-25 DIAGNOSIS — E663 Overweight: Secondary | ICD-10-CM | POA: Diagnosis not present

## 2024-05-29 DIAGNOSIS — R7303 Prediabetes: Secondary | ICD-10-CM | POA: Diagnosis not present

## 2024-05-29 DIAGNOSIS — R635 Abnormal weight gain: Secondary | ICD-10-CM | POA: Diagnosis not present

## 2024-05-29 DIAGNOSIS — E782 Mixed hyperlipidemia: Secondary | ICD-10-CM | POA: Diagnosis not present

## 2024-05-29 DIAGNOSIS — E559 Vitamin D deficiency, unspecified: Secondary | ICD-10-CM | POA: Diagnosis not present

## 2024-06-11 DIAGNOSIS — M25551 Pain in right hip: Secondary | ICD-10-CM | POA: Diagnosis not present
